# Patient Record
Sex: Male | Born: 1975 | Race: White | Hispanic: No | Marital: Married | Smoking: Current every day smoker
Health system: Southern US, Community
[De-identification: ages and names within clinical notes are randomized; demographics above are authoritative.]

---

## 2004-07-26 ENCOUNTER — Emergency Department: Payer: Self-pay | Admitting: General Practice

## 2004-11-09 ENCOUNTER — Emergency Department: Payer: Self-pay | Admitting: Emergency Medicine

## 2005-01-25 ENCOUNTER — Emergency Department: Payer: Self-pay | Admitting: Emergency Medicine

## 2005-01-27 ENCOUNTER — Emergency Department: Payer: Self-pay | Admitting: Emergency Medicine

## 2006-02-01 ENCOUNTER — Emergency Department: Payer: Self-pay | Admitting: Emergency Medicine

## 2006-02-24 ENCOUNTER — Emergency Department: Payer: Self-pay | Admitting: Internal Medicine

## 2006-03-04 ENCOUNTER — Emergency Department: Payer: Self-pay | Admitting: Emergency Medicine

## 2006-07-10 ENCOUNTER — Emergency Department: Payer: Self-pay | Admitting: Unknown Physician Specialty

## 2006-10-03 ENCOUNTER — Emergency Department: Payer: Self-pay

## 2006-10-06 ENCOUNTER — Emergency Department: Payer: Self-pay | Admitting: Emergency Medicine

## 2006-12-20 ENCOUNTER — Emergency Department: Payer: Self-pay | Admitting: Emergency Medicine

## 2007-04-29 ENCOUNTER — Emergency Department: Payer: Self-pay | Admitting: Emergency Medicine

## 2007-12-10 ENCOUNTER — Emergency Department: Payer: Self-pay | Admitting: Emergency Medicine

## 2008-05-07 ENCOUNTER — Emergency Department: Payer: Self-pay | Admitting: Emergency Medicine

## 2008-06-30 ENCOUNTER — Emergency Department: Payer: Self-pay | Admitting: Emergency Medicine

## 2008-07-06 ENCOUNTER — Emergency Department: Payer: Self-pay | Admitting: Emergency Medicine

## 2008-11-23 ENCOUNTER — Emergency Department: Payer: Self-pay | Admitting: Unknown Physician Specialty

## 2009-03-10 ENCOUNTER — Emergency Department: Payer: Self-pay

## 2009-03-23 ENCOUNTER — Emergency Department: Payer: Self-pay | Admitting: Emergency Medicine

## 2009-04-19 ENCOUNTER — Emergency Department: Payer: Self-pay | Admitting: Emergency Medicine

## 2009-04-21 ENCOUNTER — Emergency Department: Payer: Self-pay | Admitting: Unknown Physician Specialty

## 2009-09-28 ENCOUNTER — Emergency Department: Payer: Self-pay | Admitting: Emergency Medicine

## 2010-07-13 ENCOUNTER — Emergency Department: Payer: Self-pay | Admitting: Emergency Medicine

## 2010-11-10 ENCOUNTER — Emergency Department (HOSPITAL_COMMUNITY)
Admission: EM | Admit: 2010-11-10 | Discharge: 2010-11-11 | Disposition: A | Discharge status: Home / Self Care | Payer: Self-pay | Attending: Emergency Medicine | Admitting: Emergency Medicine

## 2010-11-10 DIAGNOSIS — IMO0001 Reserved for inherently not codable concepts without codable children: Secondary | ICD-10-CM | POA: Insufficient documentation

## 2010-11-10 DIAGNOSIS — R630 Anorexia: Secondary | ICD-10-CM | POA: Insufficient documentation

## 2010-11-10 DIAGNOSIS — R509 Fever, unspecified: Secondary | ICD-10-CM | POA: Insufficient documentation

## 2010-11-10 DIAGNOSIS — R5381 Other malaise: Secondary | ICD-10-CM | POA: Insufficient documentation

## 2010-11-10 DIAGNOSIS — R059 Cough, unspecified: Secondary | ICD-10-CM | POA: Insufficient documentation

## 2010-11-10 DIAGNOSIS — R05 Cough: Secondary | ICD-10-CM | POA: Insufficient documentation

## 2010-11-10 DIAGNOSIS — B9789 Other viral agents as the cause of diseases classified elsewhere: Secondary | ICD-10-CM | POA: Insufficient documentation

## 2010-11-10 DIAGNOSIS — R110 Nausea: Secondary | ICD-10-CM | POA: Insufficient documentation

## 2010-11-10 DIAGNOSIS — E876 Hypokalemia: Secondary | ICD-10-CM | POA: Insufficient documentation

## 2010-11-10 DIAGNOSIS — R000 Tachycardia, unspecified: Secondary | ICD-10-CM | POA: Insufficient documentation

## 2010-11-10 DIAGNOSIS — R5383 Other fatigue: Secondary | ICD-10-CM | POA: Insufficient documentation

## 2010-11-10 LAB — BASIC METABOLIC PANEL
CO2: 29 mEq/L (ref 19–32)
Calcium: 9.1 mg/dL (ref 8.4–10.5)
Chloride: 102 mEq/L (ref 96–112)
GFR calc Af Amer: >60 mL/min (ref >60)
Sodium: 136 mEq/L (ref 135–145)

## 2010-11-10 LAB — DIFFERENTIAL
Basophils Absolute: 0 10*3/uL (ref 0.0–0.1)
Basophils Relative: 0 % (ref 0–1)
Neutro Abs: 4.6 10*3/uL (ref 1.7–7.7)
Neutrophils Relative %: 62 % (ref 43–77)

## 2010-11-10 LAB — CBC
Hemoglobin: 14.2 g/dL (ref 13.0–17.0)
RBC: 4.72 MIL/uL (ref 4.22–5.81)

## 2010-11-11 LAB — URINALYSIS, ROUTINE W REFLEX MICROSCOPIC
Glucose, UA: NEGATIVE mg/dL
Hgb urine dipstick: NEGATIVE
Specific Gravity, Urine: 1.023 (ref 1.005–1.030)
Urobilinogen, UA: 1 mg/dL (ref 0.0–1.0)

## 2011-04-26 ENCOUNTER — Emergency Department: Payer: Self-pay | Admitting: Emergency Medicine

## 2011-08-01 ENCOUNTER — Emergency Department: Payer: Self-pay | Admitting: Emergency Medicine

## 2011-09-01 ENCOUNTER — Emergency Department: Payer: Self-pay | Admitting: Unknown Physician Specialty

## 2012-04-09 ENCOUNTER — Emergency Department: Payer: Self-pay | Admitting: Emergency Medicine

## 2012-10-07 ENCOUNTER — Emergency Department: Payer: Self-pay | Admitting: Emergency Medicine

## 2013-01-08 ENCOUNTER — Emergency Department: Payer: Self-pay | Admitting: Emergency Medicine

## 2013-03-18 ENCOUNTER — Emergency Department: Payer: Self-pay | Admitting: Emergency Medicine

## 2013-03-18 LAB — URINALYSIS, COMPLETE
Hyaline Cast: 3
Leukocyte Esterase: NEGATIVE
Ph: 5 (ref 4.5–8.0)
RBC,UR: <1 /HPF (ref 0–5)

## 2014-06-03 ENCOUNTER — Emergency Department: Payer: Self-pay | Admitting: Emergency Medicine

## 2014-06-03 LAB — COMPREHENSIVE METABOLIC PANEL
ALBUMIN: 4.2 g/dL (ref 3.4–5.0)
ALK PHOS: 85 U/L
ALT: 35 U/L
Anion Gap: 6 — ABNORMAL LOW (ref 7–16)
BILIRUBIN TOTAL: 0.2 mg/dL (ref 0.2–1.0)
BUN: 12 mg/dL (ref 7–18)
CALCIUM: 9.3 mg/dL (ref 8.5–10.1)
CREATININE: 0.77 mg/dL (ref 0.60–1.30)
Chloride: 100 mmol/L (ref 98–107)
Co2: 32 mmol/L (ref 21–32)
EGFR (African American): >60
EGFR (Non-African Amer.): >60
GLUCOSE: 115 mg/dL — AB (ref 65–99)
OSMOLALITY: 276 (ref 275–301)
POTASSIUM: 3.5 mmol/L (ref 3.5–5.1)
SGOT(AST): 26 U/L (ref 15–37)
Sodium: 138 mmol/L (ref 136–145)
Total Protein: 7.6 g/dL (ref 6.4–8.2)

## 2014-06-03 LAB — CBC WITH DIFFERENTIAL/PLATELET
BASOS ABS: 0.2 10*3/uL — AB (ref 0.0–0.1)
Basophil %: 0.9 %
EOS ABS: 0.2 10*3/uL (ref 0.0–0.7)
EOS PCT: 1.2 %
HCT: 47.4 % (ref 40.0–52.0)
HGB: 16.2 g/dL (ref 13.0–18.0)
Lymphocyte #: 2.6 10*3/uL (ref 1.0–3.6)
Lymphocyte %: 14.8 %
MCH: 30.5 pg (ref 26.0–34.0)
MCHC: 34.2 g/dL (ref 32.0–36.0)
MCV: 89 fL (ref 80–100)
MONO ABS: 1.3 x10 3/mm — AB (ref 0.2–1.0)
MONOS PCT: 7.2 %
NEUTROS ABS: 13.3 10*3/uL — AB (ref 1.4–6.5)
Neutrophil %: 75.9 %
Platelet: 252 10*3/uL (ref 150–440)
RBC: 5.31 10*6/uL (ref 4.40–5.90)
RDW: 12.7 % (ref 11.5–14.5)
WBC: 17.6 10*3/uL — AB (ref 3.8–10.6)

## 2014-06-03 LAB — URINALYSIS, COMPLETE
BACTERIA: NONE SEEN
BILIRUBIN, UR: NEGATIVE
BLOOD: NEGATIVE
Glucose,UR: NEGATIVE mg/dL (ref 0–75)
KETONE: NEGATIVE
Leukocyte Esterase: NEGATIVE
Nitrite: NEGATIVE
PH: 7 (ref 4.5–8.0)
Protein: NEGATIVE
RBC,UR: NONE SEEN /HPF (ref 0–5)
SQUAMOUS EPITHELIAL: NONE SEEN
Specific Gravity: 1.016 (ref 1.003–1.030)
WBC UR: NONE SEEN /HPF (ref 0–5)

## 2014-06-03 LAB — LIPASE, BLOOD: Lipase: 90 U/L (ref 73–393)

## 2016-01-26 ENCOUNTER — Encounter: Payer: Self-pay | Admitting: Medical Oncology

## 2016-01-26 ENCOUNTER — Emergency Department
Admission: EM | Admit: 2016-01-26 | Discharge: 2016-01-26 | Disposition: A | Discharge status: Home / Self Care | Payer: Self-pay | Attending: Emergency Medicine | Admitting: Emergency Medicine

## 2016-01-26 DIAGNOSIS — Y939 Activity, unspecified: Secondary | ICD-10-CM | POA: Insufficient documentation

## 2016-01-26 DIAGNOSIS — Y929 Unspecified place or not applicable: Secondary | ICD-10-CM | POA: Insufficient documentation

## 2016-01-26 DIAGNOSIS — S80262A Insect bite (nonvenomous), left knee, initial encounter: Secondary | ICD-10-CM | POA: Insufficient documentation

## 2016-01-26 DIAGNOSIS — W57XXXA Bitten or stung by nonvenomous insect and other nonvenomous arthropods, initial encounter: Secondary | ICD-10-CM | POA: Insufficient documentation

## 2016-01-26 DIAGNOSIS — Y999 Unspecified external cause status: Secondary | ICD-10-CM | POA: Insufficient documentation

## 2016-01-26 MED ORDER — TETANUS-DIPHTHERIA TOXOIDS TD 5-2 LFU IM INJ
0.5000 mL | INJECTION | Freq: Once | INTRAMUSCULAR | Status: AC
  Administered 2016-01-26: 0.5 mL via INTRAMUSCULAR

## 2016-01-26 MED ORDER — DOXYCYCLINE HYCLATE 100 MG PO TBEC: 100.0000 mg | DELAYED_RELEASE_TABLET | Freq: Two times a day (BID) | ORAL | Status: DC

## 2016-01-26 NOTE — ED Notes (Signed)
Pt reports he had a tick Friday that he removed from left leg. Pt reports body aches since last night.

## 2016-01-26 NOTE — ED Notes (Signed)
MD at bedside. 

## 2016-01-26 NOTE — ED Notes (Signed)
Pt presents with tick bite to his left posterior knee. Pt states it was removed yesterday and he began to feel "sick" this morning with fatigue, cramping, joint pain.

## 2016-01-26 NOTE — ED Provider Notes (Signed)
Time Seen: Approximately 1830  I have reviewed the triage notes  Chief Complaint: tick bite    History of Present Illness: Lucas Cook is a 40 y.o. male who apparently was found with a tick located behind his left knee. Apparently it was removed and the patient started to feel this morning some nausea and generalized fatigue some cramping in joint discomfort. There was no rash or fever was noted. [place] and has exposure to multiple ticks. The patient's wife removed the tick and states that she was able to remove all portions of the tick and it definitely fed Tetanus status is unknown  History reviewed. No pertinent past medical history.  There are no active problems to display for this patient.   No past surgical history on file.  No past surgical history on file.  No current outpatient prescriptions on file.  Allergies:  Review of patient's allergies indicates no known allergies.  Family History: No family history on file.  Social History: Social History  Substance Use Topics  . Smoking status: None  . Smokeless tobacco: None  . Alcohol Use: None     Review of Systems:   10 point review of systems was performed and was otherwise negative:  Constitutional: No fever Eyes: No visual disturbances ENT: No sore throat, ear pain Cardiac: No chest pain Respiratory: No shortness of breath, wheezing, or stridor Abdomen: No abdominal pain, no vomiting, No diarrhea Endocrine: No weight loss, No night sweats Extremities: No peripheral edema, cyanosis Skin: No rashes, easy bruising Neurologic: No focal weakness, trouble with speech or swollowing Urologic: No dysuria, Hematuria, or urinary frequency *  Physical Exam:  ED Triage Vitals  Enc Vitals Group     BP 01/26/16 1758 127/83 mmHg     Pulse Rate 01/26/16 1758 59     Resp 01/26/16 1758 18     Temp 01/26/16 1758 97.5 F (36.4 C)     Temp Source 01/26/16 1758 Oral   SpO2 01/26/16 1758 99 %     Weight 01/26/16 1758 140 lb (63.504 kg)     Height 01/26/16 1758  (1.753 m)     Head Cir --      Peak Flow --      Pain Score 01/26/16 1759 6     Pain Loc --      Pain Edu? --      Excl. in GC? --     General: Awake , Alert , and Oriented times 3; GCS 15 Head: Normal cephalic , atraumatic Eyes: Pupils equal , round, reactive to light Nose/Throat: No nasal drainage, patent upper airway without erythema or exudate.  Neck: Supple, Full range of motion, No anterior adenopathy or palpable thyroid masses Lungs: Clear to ascultation without wheezes , rhonchi, or rales Heart: Regular rate, regular rhythm without murmurs , gallops , or rubs Abdomen: Soft, non tender without rebound, guarding , or rigidity; bowel sounds positive and symmetric in all 4 quadrants. No organomegaly .        Extremities: Visualization of the wound shows 2 areas of red puncture wounds behind the surface of his left knee. There seems to be some tenderness to underlying superficial vein and possible early thrombophlebitis. Neurologic: normal ambulation, Motor symmetric without deficits, sensory intact Skin: warm, dry, no rashes   L ED Course:  The patient was given a tetanus shot here and will be prescribed doxycycline for a full 7 day course.  Patient was  advised to place warm compresses over the tick bite site. Side effect instructions were given on doxycycline such as sun exposure and GI irritation.    Assessment:  Tick bite Thrombophlebitis     Plan: * Outpatient management Patient was advised to return immediately if condition worsens. Patient was advised to follow up with their primary care physician or other specialized physicians involved in their outpatient care. The patient and/or family member/power of attorney had laboratory results reviewed at the bedside. All questions and concerns were addressed and appropriate discharge instructions were distributed by the  nursing staff.             Jennye MoccasinBrian S Kenlynn Houde, MD 01/26/16 [phone]

## 2016-01-26 NOTE — Discharge Instructions (Signed)
Tick Bite Information Ticks are insects that attach themselves to the skin. There are many types of ticks. Common types include wood ticks and deer ticks. Sometimes, ticks carry diseases that can make a person very ill. The most common places for ticks to attach themselves are the scalp, neck, armpits, waist, and groin.  HOW CAN YOU PREVENT TICK BITES? Take these steps to help prevent tick bites when you are outdoors:  Wear long sleeves and long pants.  Wear white clothes so you can see ticks more easily.  Tuck your pant legs into your socks.  If walking on a trail, stay in the middle of the trail to avoid brushing against bushes.  Avoid walking through areas with long grass.  Put bug spray on all skin that is showing and along boot tops, pant legs, and sleeve cuffs.  Check clothes, hair, and skin often and before going inside.  Brush off any ticks that are not attached.  Take a shower or bath as soon as possible after being outdoors. HOW SHOULD YOU REMOVE A TICK? Ticks should be removed as soon as possible to help prevent diseases. 1. If latex gloves are available, put them on before trying to remove a tick. 2. Use tweezers to grasp the tick as close to the skin as possible. You may also use curved forceps or a tick removal tool. Grasp the tick as close to its head as possible. Avoid grasping the tick on its body. 3. Pull gently upward until the tick lets go. Do not twist the tick or jerk it suddenly. This may break off the tick's head or mouth parts. 4. Do not squeeze or crush the tick's body. This could force disease-carrying fluids from the tick into your body. 5. After the tick is removed, wash the bite area and your hands with soap and water or alcohol. 6. Apply a small amount of antiseptic cream or ointment to the bite site. 7. Wash any tools that were used. Do not try to remove a tick by applying a hot match, petroleum jelly, or fingernail polish to the tick. These methods do  not work. They may also increase the chances of disease being spread from the tick bite. WHEN SHOULD YOU SEEK HELP? Contact your health care provider if you are unable to remove a tick or if a part of the tick breaks off in the skin. After a tick bite, you need to watch for signs and symptoms of diseases that can be spread by ticks. Contact your health care provider if you develop any of the following:  Fever.  Rash.  Redness and puffiness (swelling) in the area of the tick bite.  Tender, puffy lymph glands.  Watery poop (diarrhea).  Weight loss.  Cough.  Feeling more tired than normal (fatigue).  Muscle, joint, or bone pain.  Belly (abdominal) pain.  Headache.  Change in your level of consciousness.  Trouble walking or moving your legs.  Loss of feeling (numbness) in the legs.  Loss of movement (paralysis).  Shortness of breath.  Confusion.  Throwing up (vomiting) many times.   This information is not intended to replace advice given to you by your health care provider. Make sure you discuss any questions you have with your health care provider.   Document Released: 11/09/2009 Document Revised: 04/17/2013 Document Reviewed: 01/23/2013 Elsevier Interactive Patient Education [employer]  Please return immediately if condition worsens. Please contact her primary physician or the physician you were given for referral. If  If you have any specialist physicians involved in her treatment and plan please also contact them. Thank you for using [facility]. ° °

## 2016-01-26 NOTE — ED Notes (Signed)
Discussed discharge instructions, prescriptions, and follow-up care with patient. No questions or concerns at this time. Pt stable at discharge.  

## 2016-06-20 ENCOUNTER — Ambulatory Visit: Payer: Self-pay | Admitting: Unknown Physician Specialty

## 2017-03-09 ENCOUNTER — Other Ambulatory Visit: Payer: Self-pay | Admitting: Neurology

## 2017-03-09 DIAGNOSIS — M5416 Radiculopathy, lumbar region: Secondary | ICD-10-CM

## 2017-03-15 ENCOUNTER — Ambulatory Visit: Payer: No Typology Code available for payment source

## 2017-06-06 ENCOUNTER — Ambulatory Visit: Payer: Self-pay | Admitting: Registered Nurse

## 2017-06-06 VITALS — BP 120/75 | HR 73 | Temp 98.1°F

## 2017-06-06 DIAGNOSIS — R202 Paresthesia of skin: Secondary | ICD-10-CM

## 2017-06-06 MED ORDER — IBUPROFEN 800 MG PO TABS: 800.0000 mg | ORAL_TABLET | Freq: Three times a day (TID) | ORAL | 0 refills | Status: AC | PRN

## 2017-06-06 NOTE — Patient Instructions (Signed)
Wrist and Forearm Exercises Ask your health care provider which exercises are safe for you. Do exercises exactly as told by your health care provider and adjust them as directed. It is normal to feel mild stretching, pulling, tightness, or discomfort as you do these exercises, but you should stop right away if you feel sudden pain or your pain gets worse. Do not begin these exercises until told by your health care provider. RANGE OF MOTION EXERCISES These exercises warm up your muscles and joints and improve the movement and flexibility of your injured wrist and forearm. These exercises also help to relieve pain, numbness, and tingling. These exercises are done using the muscles in your injured wrist and forearm. Exercise A: Wrist Flexion, Active 1. With your fingers relaxed, bend your wrist forward as far as you can. 2. Hold this position for __________ seconds. Repeat __________ times. Complete this exercise __________ times a day. Exercise B: Wrist Extension, Active 1. With your fingers relaxed, bend your wrist backward as far as you can. 2. Hold this position for __________ seconds. Repeat __________ times. Complete this exercise __________ times a day. Exercise C: Supination, Active  1. Stand or sit with your arms at your sides. 2. Bend your left / right elbow to an "L" shape (90 degrees). 3. Turn your palm upward until you feel a gentle stretch on the inside of your forearm. 4. Hold this position for __________ seconds. 5. Slowly return your palm to the starting position. Repeat __________ times. Complete this exercise __________ times a day. Exercise D: Pronation, Active  1. Stand or sit with your arms at your sides. 2. Bend your left / right elbow to an "L" shape (90 degrees). 3. Turn your palm downward until you feel a gentle stretch on the top of your forearm. 4. Hold this position for __________ seconds. 5. Slowly return your palm to the starting position. Repeat __________  times. Complete this exercise __________ times a day. STRETCHING EXERCISES These exercises warm up your muscles and joints and improve the movement and flexibility of your injured wrist and forearm. These exercises also help to relieve pain, numbness, and tingling. These exercises are done using your healthy wrist and forearm to help stretch the muscles in your injured wrist and forearm. Exercise E: Wrist Flexion, Passive  1. Extend your left / right arm in front of you, relax your wrist, and point your fingers downward. 2. Gently push on the back of your hand. Stop when you feel a gentle stretch on the top of your forearm. 3. Hold this position for __________ seconds. Repeat __________ times. Complete this exercise __________ times a day. Exercise F: Wrist Extension, Passive  1. Extend your left / right arm in front of you and turn your palm upward. 2. Gently pull your palm and fingertips back so your fingers point downward. You should feel a gentle stretch on the palm-side of your forearm. 3. Hold this position for __________ seconds. Repeat __________ times. Complete this exercise __________ times a day. Exercise G: Forearm Rotation, Supination, Passive 1. Sit with your left / right elbow bent to an "L" shape (90 degrees) with your forearm resting on a table. 2. Keeping your upper body and shoulder still, use your other hand to rotate your forearm palm-up until you feel a gentle to moderate stretch. 3. Hold this position for __________ seconds. 4. Slowly release the stretch and return to the starting position. Repeat __________ times. Complete this exercise __________ times a day. Exercise H:   Forearm Rotation, Pronation, Passive 1. Sit with your left / right elbow bent to an "L" shape (90 degrees) with your forearm resting on a table. 2. Keeping your upper body and shoulder still, use your other hand to rotate your forearm palm-down until you feel a gentle to moderate stretch. 3. Hold this  position for __________ seconds. 4. Slowly release the stretch and return to the starting position. Repeat __________ times. Complete this exercise __________ times a day. STRENGTHENING EXERCISES These exercises build strength and endurance in your wrist and forearm. Endurance is the ability to use your muscles for a long time, even after they get tired. Exercise I: Wrist Flexors  1. Sit with your left / right forearm supported on a table and your hand resting palm-up over the edge of the table. Your elbow should be bent to an "L" shape (about 90 degrees) and be below the level of your shoulder. 2. Hold a __________ weight in your left / right hand. Or, hold a rubber exercise band or tube in both hands, keeping your hands at the same level and hip distance apart. There should be a slight tension in the exercise band or tube. 3. Slowly curl your hand up toward your forearm. 4. Hold this position for __________ seconds. 5. Slowly lower your hand back to the starting position. Repeat __________ times. Complete this exercise __________ times a day. Exercise J: Wrist Extensors  1. Sit with your left / right forearm supported on a table and your hand resting palm-down over the edge of the table. Your elbow should be bent to an "L" shape (about 90 degrees) and be below the level of your shoulder. 2. Hold a __________ weight in your left / right hand. Or, hold a rubber exercise band or tube in both hands, keeping your hands at the same level and hip distance apart. There should be a slight tension in the exercise band or tube. 3. Slowly curl your hand up toward your forearm. 4. Hold this position for __________ seconds. 5. Slowly lower your hand back to the starting position. Repeat __________ times. Complete this exercise __________ times a day. Exercise K: Forearm Rotation, Supination  1. Sit with your left / right forearm supported on a table and your hand resting palm-down. Your elbow should be at  your side, bent to an "L" shape (about 90 degrees), and below the level of your shoulder. Keep your wrist stable and in a neutral position throughout the exercise. 2. Gently hold a lightweight hammer with your left / right hand. 3. Without moving your elbow or wrist, slowly rotate your palm upward to a thumbs-up position. 4. Hold this position for __________ seconds. 5. Slowly return your forearm to the starting position. Repeat __________ times. Complete this exercise __________ times a day. Exercise L: Forearm Rotation, Pronation  1. Sit with your left / right forearm supported on a table and your hand resting palm-up. Your elbow should be at your side, bent to an "L" shape (about 90 degrees), and below the level of your shoulder. Keep your wrist stable. Do not allow it to move backward or forward during the exercise. 2. Gently hold a lightweight hammer with your left / right hand. 3. Without moving your elbow or wrist, slowly rotate your palm and hand upward to a thumbs-up position. 4. Hold this position for __________ seconds. 5. Slowly return your forearm to the starting position. Repeat __________ times. Complete this exercise __________ times a day. Exercise M: Grip   Strengthening  1. Hold one of these items in your left / right hand: play dough, therapy putty, a dense sponge, a stress ball, or a large, rolled sock. 2. Squeeze as hard as you can without increasing pain. 3. Hold this position for __________ seconds. 4. Slowly release your grip. Repeat __________ times. Complete this exercise __________ times a day. This information is not intended to replace advice given to you by your health care provider. Make sure you discuss any questions you have with your health care provider. Document Released: 06/29/2005 Document Revised: 05/09/2016 Document Reviewed: 05/10/2015 Elsevier Interactive Patient Education  2018 [employer]. Carpal Tunnel Syndrome Carpal tunnel syndrome is a  condition that causes pain in your hand and arm. The carpal tunnel is a narrow area that is on the palm side of your wrist. Repeated wrist motion or certain diseases may cause swelling in the tunnel. This swelling can pinch the main nerve in the wrist (median nerve). Follow these instructions at home: If you have a splint:  Wear it as told by your doctor. Remove it only as told by your doctor.  Loosen the splint if your fingers: ? Become numb and tingle. ? Turn blue and cold.  Keep the splint clean and dry. General instructions  Take over-the-counter and prescription medicines only as told by your doctor.  Rest your wrist from any activity that may be causing your pain. If needed, talk to your employer about changes that can be made in your work, such as getting a wrist pad to use while typing.  If directed, apply ice to the painful area: ? Put ice in a plastic bag. ? Place a towel between your skin and the bag. ? Leave the ice on for 20 minutes, 2-3 times per day.  Keep all follow-up visits as told by your doctor. This is important.  Do any exercises as told by your doctor, physical therapist, or occupational therapist. Contact a doctor if:  You have new symptoms.  Medicine does not help your pain.  Your symptoms get worse. This information is not intended to replace advice given to you by your health care provider. Make sure you discuss any questions you have with your health care provider. Document Released: 08/04/2011 Document Revised: 01/21/2016 Document Reviewed: 12/31/2014 Elsevier Interactive Patient Education  [employer].
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
## 2017-06-13 ENCOUNTER — Other Ambulatory Visit: Payer: Self-pay | Admitting: Family Medicine

## 2017-06-13 ENCOUNTER — Ambulatory Visit
Admission: RE | Admit: 2017-06-13 | Discharge: 2017-06-13 | Disposition: A | Discharge status: Home / Self Care | Payer: PRIVATE HEALTH INSURANCE | Source: Ambulatory Visit | Attending: Family Medicine | Admitting: Family Medicine

## 2017-06-13 DIAGNOSIS — M79644 Pain in right finger(s): Secondary | ICD-10-CM | POA: Insufficient documentation

## 2017-06-13 DIAGNOSIS — M79641 Pain in right hand: Secondary | ICD-10-CM

## 2017-06-13 DIAGNOSIS — Z8781 Personal history of (healed) traumatic fracture: Secondary | ICD-10-CM | POA: Diagnosis not present

## 2018-02-26 ENCOUNTER — Encounter: Payer: Self-pay | Admitting: *Deleted

## 2018-02-26 ENCOUNTER — Ambulatory Visit: Payer: Self-pay | Admitting: *Deleted

## 2018-02-26 DIAGNOSIS — S61210A Laceration without foreign body of right index finger without damage to nail, initial encounter: Secondary | ICD-10-CM

## 2018-02-26 NOTE — Patient Instructions (Signed)
[facility] for further [facility] and likely sutures. A member of Facilities will transport you. I will receive their report of any restrictions today or tomorrow. They will likely have you return in 7-10 days for suture removal. Watch for any signs of infection including redness, warmth, swelling, pain, oozing/draining. Follow their instructions for wound care. Follow up with me in clinic as needed.

## 2018-02-26 NOTE — Progress Notes (Signed)
Pt presents with laceration to R index finger, just proximal to DIP joint. Occurred at approx 1010 today. Was unboxing a serrated butter knife and trying to get a zip tie off it and cut finger. Last tetanus booster 1-2 years ago per pt. May 2017 per epic chart review.  Pressure held to site for 20min but still oozing. Wounds edges appear to approximate well. Bleeding controlled enough to see depth of laceration worsens as it extends from lateral to anterior aspect of finger. Wound extends at least past the dermis into subcutaneous tissues.  Unable to visualize more detail due to bleeding.   CMS intact. Do not suspect tendon or ligament injury, fracture, or retained foreign body. Wound cleansed as possible with first aid antiseptic spray. Gauze layers replaced with clean dressings and wrapped tightly with cobain as pressure dressing to help complete hemostasis. Pt advised to unwrap and manually hold pressure if sensation is lost, or apply additional dressings and pressure manually if he begins bleeding through pressure dressing.   [facility] Authorization form completed for pt and [facility] further treatment including sutures as this cannot be performed on site. Pt transported by member of facilities team. Message left for [facility] with pt's demographics, injury report, and requested treatment plan including Initial WC visit and non-NIDA UDS per protocol. Pt to f/u in RN clinic prn. He denies any further questions/concerns.

## 2018-02-27 ENCOUNTER — Ambulatory Visit: Payer: Self-pay | Admitting: Registered Nurse

## 2018-02-27 ENCOUNTER — Encounter: Payer: Self-pay | Admitting: Registered Nurse

## 2018-02-27 DIAGNOSIS — S61219D Laceration without foreign body of unspecified finger without damage to nail, subsequent encounter: Secondary | ICD-10-CM

## 2018-02-27 NOTE — Patient Instructions (Signed)
Sutured Wound Care Sutures are stitches that can be used to close wounds. Taking care of your wound properly can help to prevent pain and infection. It can also help your wound to heal more quickly. How is this treated? Wound Care  Keep the wound clean and dry.  If you were given a bandage (dressing), you should change it at least once per day or as directed by your health care provider. You should also change it if it becomes wet or dirty.  Keep the wound completely dry for the first 24 hours or as directed by your health care provider. After that time, you may shower or bathe. However, make sure that the wound is not soaked in water until the sutures have been removed.  Clean the wound one time each day or as directed by your health care provider. ? Wash the wound with soap and water. ? Rinse the wound with water to remove all soap. ? Pat the wound dry with a clean towel. Do not rub the wound.  Aftercleaning the wound, apply a thin layer of antibioticointment as directed by your health care provider. This will help to prevent infection and keep the dressing from sticking to the wound.  Have the sutures removed as directed by your health care provider. General Instructions  Take or apply medicines only as directed by your health care provider.  To help prevent scarring, make sure to cover your wound with sunscreen whenever you are outside after the sutures are removed and the wound is healed. Make sure to wear a sunscreen of at least 30 SPF.  If you were prescribed an antibiotic medicine or ointment, finish all of it even if you start to feel better.  Do not scratch or pick at the wound.  Keep all follow-up visits as directed by your health care provider. This is important.  Check your wound every day for signs of infection. Watch for: ? Redness, swelling, or pain. ? Fluid, blood, or pus.  Raise (elevate) the injured area above the level of your heart while you are sitting or  lying down, if possible.  Avoid stretching your wound.  Drink enough fluids to keep your urine clear or pale yellow. Contact a health care provider if:  You received a tetanus shot and you have swelling, severe pain, redness, or bleeding at the injection site.  You have a fever.  A wound that was closed breaks open.  You notice a bad smell coming from the wound.  You notice something coming out of the wound, such as wood or glass.  Your pain is not controlled with medicine.  You have increased redness, swelling, or pain at the site of your wound.  You have fluid, blood, or pus coming from your wound.  You notice a change in the color of your skin near your wound.  You need to change the dressing frequently due to fluid, blood, or pus draining from the wound.  You develop a new rash.  You develop numbness around the wound. Get help right away if:  You develop severe swelling around the injury site.  Your pain suddenly increases and is severe.  You develop painful lumps near the wound or on skin that is anywhere on your body.  You have a red streak going away from your wound.  The wound is on your hand or foot and you cannot properly move a finger or toe.  The wound is on your hand or foot and you notice  that your fingers or toes look pale or bluish. This information is not intended to replace advice given to you by your health care provider. Make sure you discuss any questions you have with your health care provider. Document Released: 09/22/2004 Document Revised: 01/21/2016 Document Reviewed: 03/27/2013 Elsevier Interactive Patient Education  2017 [employer] Laceration Care, Adult A laceration is a cut that goes through all of the layers of the skin and into the tissue that is right under the skin. Some lacerations heal on their own. Others need to be closed with stitches (sutures), staples, skin adhesive strips, or skin glue. Proper laceration care minimizes the  risk of infection and helps the laceration to heal better. How is this treated? If sutures or staples were used:  Keep the wound clean and dry.  If you were given a bandage (dressing), you should change it at least one time per day or as told by your health care provider. You should also change it if it becomes wet or dirty.  Keep the wound completely dry for the first 24 hours or as told by your health care provider. After that time, you may shower or bathe. However, make sure that the wound is not soaked in water until after the sutures or staples have been removed.  Clean the wound one time each day or as told by your health care provider: ? Wash the wound with soap and water. ? Rinse the wound with water to remove all soap. ? Pat the wound dry with a clean towel. Do not rub the wound.  After cleaning the wound, apply a thin layer of antibiotic ointmentas told by your health care provider. This will help to prevent infection and keep the dressing from sticking to the wound.  Have the sutures or staples removed as told by your health care provider. If skin adhesive strips were used:  Keep the wound clean and dry.  If you were given a bandage (dressing), you should change it at least one time per day or as told by your health care provider. You should also change it if it becomes dirty or wet.  Do not get the skin adhesive strips wet. You may shower or bathe, but be careful to keep the wound dry.  If the wound gets wet, pat it dry with a clean towel. Do not rub the wound.  Skin adhesive strips fall off on their own. You may trim the strips as the wound heals. Do not remove skin adhesive strips that are still stuck to the wound. They will fall off in time. If skin glue was used:  Try to keep the wound dry, but you may briefly wet it in the shower or bath. Do not soak the wound in water, such as by swimming.  After you have showered or bathed, gently pat the wound dry with a clean  towel. Do not rub the wound.  Do not do any activities that will make you sweat heavily until the skin glue has fallen off on its own.  Do not apply liquid, cream, or ointment medicine to the wound while the skin glue is in place. Using those may loosen the film before the wound has healed.  If you were given a bandage (dressing), you should change it at least one time per day or as told by your health care provider. You should also change it if it becomes dirty or wet.  If a dressing is placed over the wound, be careful not  to apply tape directly over the skin glue. Doing that may cause the glue to be pulled off before the wound has healed.  Do not pick at the glue. The skin glue usually remains in place for 5-10 days, then it falls off of the skin. General Instructions  Take over-the-counter and prescription medicines only as told by your health care provider.  If you were prescribed an antibiotic medicine or ointment, take or apply it as told by your doctor. Do not stop using it even if your condition improves.  To help prevent scarring, make sure to cover your wound with sunscreen whenever you are outside after stitches are removed, after adhesive strips are removed, or when glue remains in place and the wound is healed. Make sure to wear a sunscreen of at least 30 SPF.  Do not scratch or pick at the wound.  Keep all follow-up visits as told by your health care provider. This is important.  Check your wound every day for signs of infection. Watch for: ? Redness, swelling, or pain. ? Fluid, blood, or pus.  Raise (elevate) the injured area above the level of your heart while you are sitting or lying down, if possible. Contact a health care provider if:  You received a tetanus shot and you have swelling, severe pain, redness, or bleeding at the injection site.  You have a fever.  A wound that was closed breaks open.  You notice a bad smell coming from your wound or your  dressing.  You notice something coming out of the wound, such as wood or glass.  Your pain is not controlled with medicine.  You have increased redness, swelling, or pain at the site of your wound.  You have fluid, blood, or pus coming from your wound.  You notice a change in the color of your skin near your wound.  You need to change the dressing frequently due to fluid, blood, or pus draining from the wound.  You develop a new rash.  You develop numbness around the wound. Get help right away if:  You develop severe swelling around the wound.  Your pain suddenly increases and is severe.  You develop painful lumps near the wound or on skin that is anywhere on your body.  You have a red streak going away from your wound.  The wound is on your hand or foot and you cannot properly move a finger or toe.  The wound is on your hand or foot and you notice that your fingers or toes look pale or bluish. This information is not intended to replace advice given to you by your health care provider. Make sure you discuss any questions you have with your health care provider. Document Released: 08/15/2005 Document Revised: 01/15/2016 Document Reviewed: 08/11/2014 Elsevier Interactive Patient Education  [employer]2018 Else[employer] Inc[employer]

## 2018-02-27 NOTE — Progress Notes (Signed)
Subjective:    Patient ID: Lucas Cook, male    DOB: [birth year], 42 y.o.   MRN: [id number]  42y/o caucasian established male patient here for follow up laceration yesterday at work right index finger.  Per RN Lucas [facility] due to requiring stitches and kept oozing.  Patient reported throbbed some last night but okay this am.  Wife changed bandage last night and applied antibiotic ointment.  Right hand dominant.  [facility] see paper chart copy or systoc.  Denied fever/chills/bleeding/purulent discharge.     Review of Systems  Constitutional: Negative for activity change, appetite change, chills, diaphoresis, fatigue and fever.  HENT: Negative for trouble swallowing and voice change.   Eyes: Negative for photophobia and visual disturbance.  Respiratory: Negative for cough, shortness of breath, wheezing and stridor.   Gastrointestinal: Negative for diarrhea, nausea and vomiting.  Endocrine: Negative for cold intolerance and heat intolerance.  Genitourinary: Negative for difficulty urinating.  Musculoskeletal: Positive for myalgias. Negative for arthralgias, back pain, gait problem, joint swelling, neck pain and neck stiffness.  Skin: Positive for wound. Negative for color change, pallor and rash.  Neurological: Negative for dizziness, tremors, syncope, facial asymmetry, weakness and light-headedness.  Hematological: Negative for adenopathy.  Psychiatric/Behavioral: Negative for agitation and sleep disturbance.       Objective:   Physical Exam  Constitutional: He is oriented to person, place, and time. Vital signs are normal. He appears well-developed and well-nourished. He is active and cooperative.  Non-toxic appearance. He does not have a sickly appearance. He does not appear ill. No distress.  HENT:  Head: Normocephalic and atraumatic.  Right Ear: External ear normal.  Left Ear: External ear normal.  Nose: Nose normal.  Mouth/Throat:  Oropharynx is clear and moist. No oropharyngeal exudate.  Eyes: Pupils are equal, round, and reactive to light. Conjunctivae, EOM and lids are normal. Right eye exhibits no discharge. Left eye exhibits no discharge. No scleral icterus.  Neck: Trachea normal and normal range of motion. Neck supple. No tracheal deviation present.  Cardiovascular: Normal rate, regular rhythm, normal heart sounds and intact distal pulses.  Pulmonary/Chest: Effort normal and breath sounds normal. No stridor. No respiratory distress. He has no wheezes. He has no rales.  Abdominal: Soft.  Musculoskeletal: Normal range of motion. He exhibits no edema or deformity.       Right shoulder: Normal.       Left shoulder: Normal.       Right elbow: Normal.      Left elbow: Normal.       Right hip: Normal.       Left hip: Normal.       Right knee: Normal.       Left knee: Normal.       Cervical back: Normal.       Thoracic back: Normal.       Lumbar back: Normal.       Right hand: He exhibits tenderness, laceration and swelling. He exhibits normal range of motion, no bony tenderness, normal two-point discrimination, normal capillary refill and no deformity. Normal strength noted. He exhibits no finger abduction, no thumb/finger opposition and no wrist extension trouble.       Left hand: Normal.  Right palmar distal phalanx laceration wound edges well approximately dressing clean dry no discharge or bleeding applied finger splint aluminum from clinic stock and secured with cobain  Lymphadenopathy:    He has no cervical adenopathy.  Neurological: He is  alert and oriented to person, place, and time. He has normal strength. He is not disoriented. He displays no atrophy and no tremor. No cranial nerve deficit or sensory deficit. He exhibits normal muscle tone. He displays no seizure activity. Coordination and gait normal. GCS eye subscore is 4. GCS verbal subscore is 5. GCS motor subscore is 6.  On/off exam table;in/out of chair  without difficulty gait sure and steady in hallway; bilateral hand grasp equal 5/5  Skin: Skin is warm and dry. Capillary refill takes less than 2 seconds. Laceration noted. No abrasion, no bruising, no burn, no ecchymosis, no lesion, no petechiae and no rash noted. He is not diaphoretic. No cyanosis or erythema. No pallor. Nails show no clubbing.  Palmar right 4th digit distal phalanx laceration with 2 sutures clean dry intact wound edges well approximated  Psychiatric: He has a normal mood and affect. His speech is normal and behavior is normal. Judgment and thought content normal. Cognition and memory are normal.  Vitals reviewed.         Assessment & Plan:  A-laceration right 4th digit subsequent visit  P-follow up in 1 week for repeat evaluation.  Patient doing well had recently changed dressing good compression clean dry and intact no soiling.  Applied aluminum foam splint secured with cobain.  Avoid direct impact.  Systoc discharge note put in paper record.  Follow up next week for re-evaluation/suture removal.  Patient was instructed to rest, ice and elevate right hand. Do not soak hand until laceration healed avoid pool, lake, hot tub, dirty sink water. Exitcare handout on suture care and laceration. Tylenol 1000mg  po QID prn pain continue washing with soap and water daily and then apply antiobiotic ointment with dressing change daily.  May remove splint for wound care.  Wear to protect wound/sutures at work or if doing any work at home.  Medications as directed. Call or return to clinic as needed if these symptoms worsen or fail to improve as anticipated  Patient verbalized agreement and understanding of treatment plan and had no further questions at this time. P2: ROM, injury prevention

## 2019-02-04 ENCOUNTER — Other Ambulatory Visit: Payer: Self-pay | Admitting: Neurology

## 2019-02-04 DIAGNOSIS — R200 Anesthesia of skin: Secondary | ICD-10-CM

## 2019-02-11 ENCOUNTER — Ambulatory Visit
Admission: RE | Admit: 2019-02-11 | Discharge: 2019-02-11 | Disposition: A | Discharge status: Home / Self Care | Payer: BC Managed Care – PPO | Source: Ambulatory Visit | Attending: Neurology | Admitting: Neurology

## 2019-02-11 ENCOUNTER — Other Ambulatory Visit: Payer: Self-pay

## 2019-02-11 DIAGNOSIS — R200 Anesthesia of skin: Secondary | ICD-10-CM | POA: Diagnosis present

## 2019-02-11 MED ORDER — GADOBUTROL 1 MMOL/ML IV SOLN
6.0000 mL | Freq: Once | INTRAVENOUS | Status: AC | PRN
  Administered 2019-02-11: 6 mL via INTRAVENOUS

## 2020-01-12 IMAGING — MR MRI HEAD WITHOUT AND WITH CONTRAST
13 series · 48 of 48 positions shown · IV contrast (gadavist)
Comparison: CT head 03/05/2006.

CLINICAL DATA: Intermittent numbness, weakness, and pain with
increased muscle tone of both lower extremities.

EXAM:
MRI HEAD WITHOUT AND WITH CONTRAST
TECHNIQUE: Multiplanar, multiecho pulse sequences of the brain and surrounding
structures were obtained without and with intravenous contrast.
CONTRAST:  Gadavist 6 mL.

[Series 5: ax dwi_tracew · axial · 3.0mm · 0.60mm/px · z∈[-7,+154]mm · 4 of 55 slices shown]
[im 1/55]
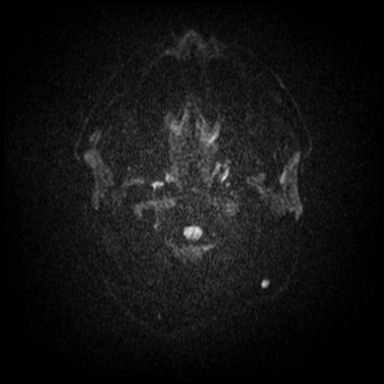
[im 19/55]
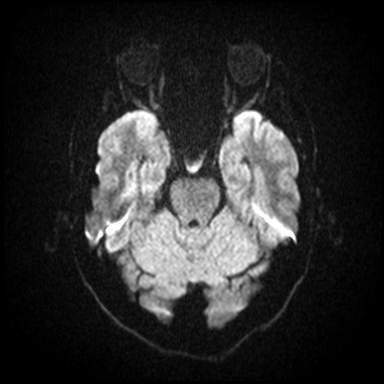
[im 37/55]
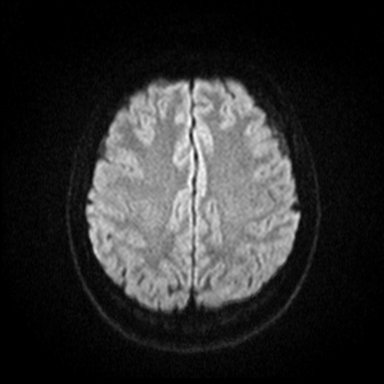
[im 55/55]
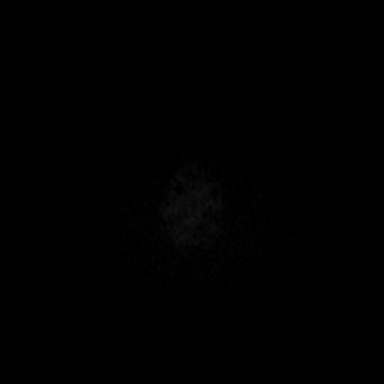

[Series 6: ax dwi_adc · axial · 3.0mm · 0.60mm/px · z∈[-7,+154]mm · 3 of 55 slices shown]
[im 1/55]
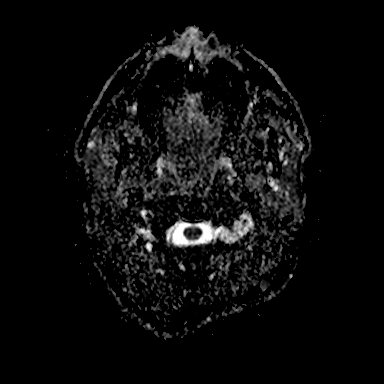
[im 28/55]
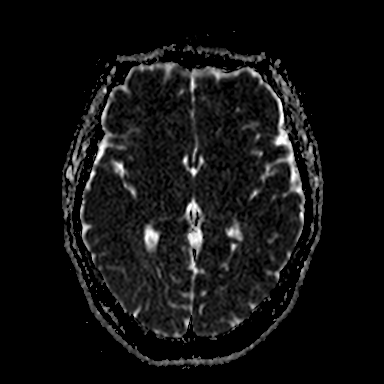
[im 55/55]
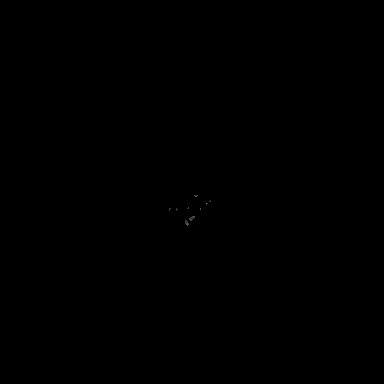

[Series 7: cor dwi_tracew · coronal · 5.0mm · 0.60mm/px · 2 of 38 slices shown]
[im 1/38]
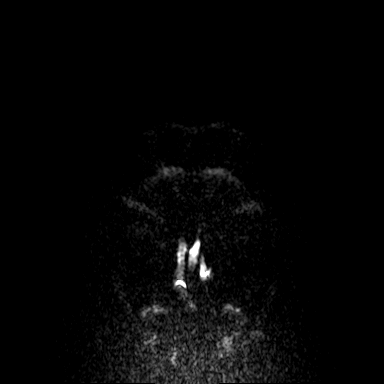
[im 38/38]
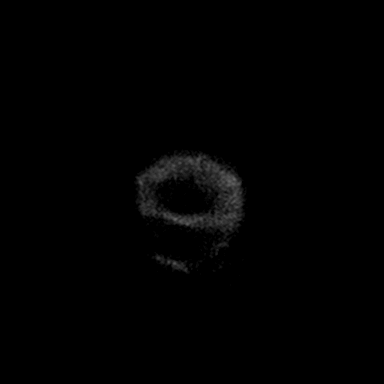

[Series 8: cor dwi_adc · coronal · 5.0mm · 0.60mm/px · 2 of 38 slices shown]
[im 1/38]
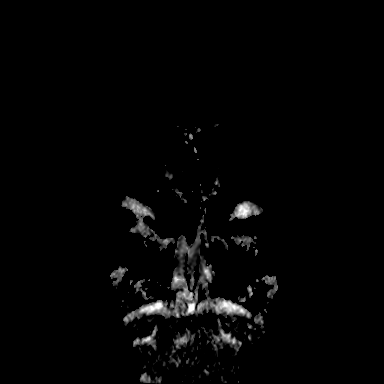
[im 38/38]
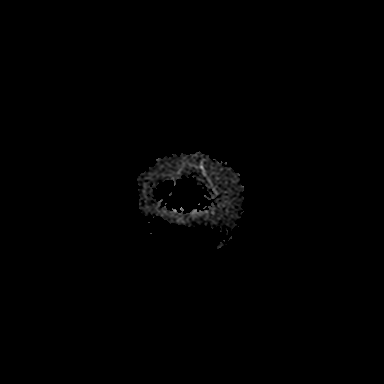

[Series 9: T1 · sagittal · 5.0mm · 0.62mm/px · 1 of 21 slices shown (1 of 2)]
[im 1/21]
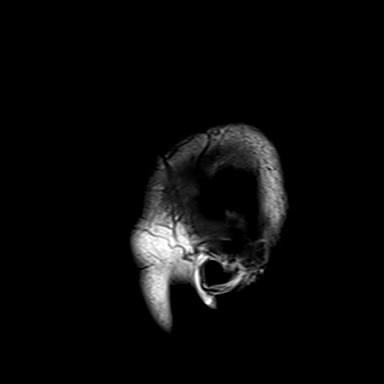

[Series 10: T2 · axial · 5.0mm · 0.53mm/px · z∈[-4,+150]mm · 2 of 27 slices shown]
[im 1/27]
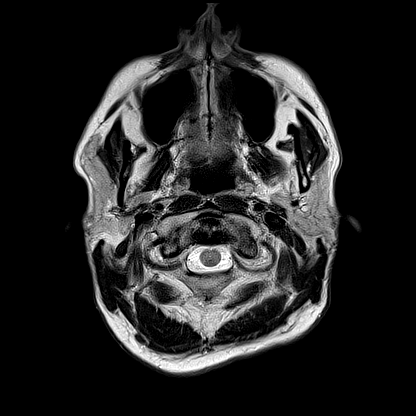
[im 27/27]
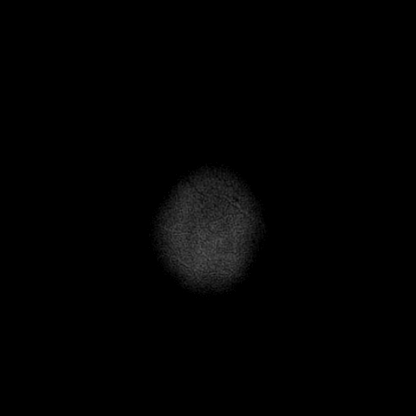

[Series 12: pha_images · axial · 3.0mm · 0.90mm/px · z∈[-13,+156]mm · 3 of 57 slices shown]
[im 1/57]
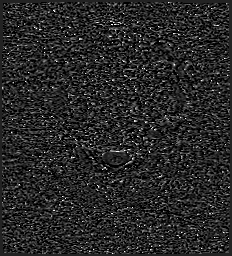
[im 29/57]
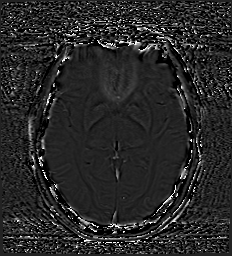
[im 57/57]
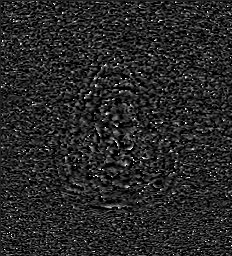

[Series 13: swi_images · axial · 3.0mm · 0.90mm/px · z∈[-13,+162]mm · 4 of 60 slices shown]
[im 1/60]
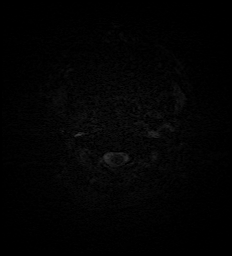
[im 20/60]
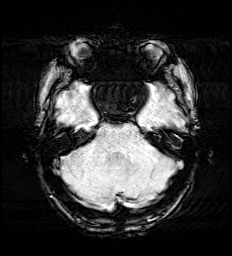
[im 40/60]
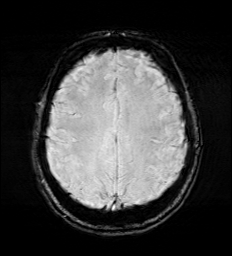
[im 60/60]
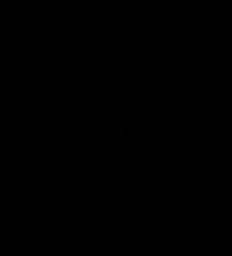

[Series 15: FLAIR · axial · 3.0mm · 0.53mm/px · z∈[-7,+153]mm · 3 of 55 slices shown]
[im 1/55]
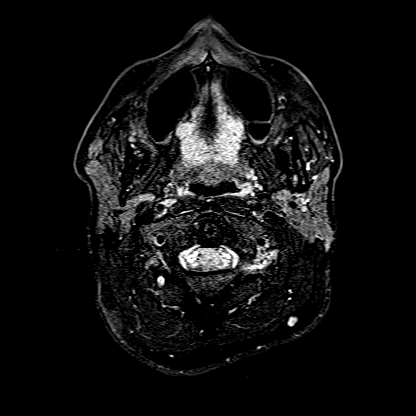
[im 28/55]
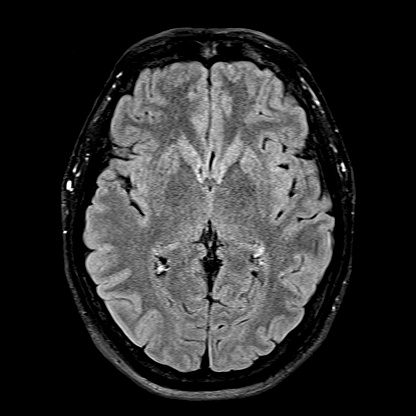
[im 55/55]
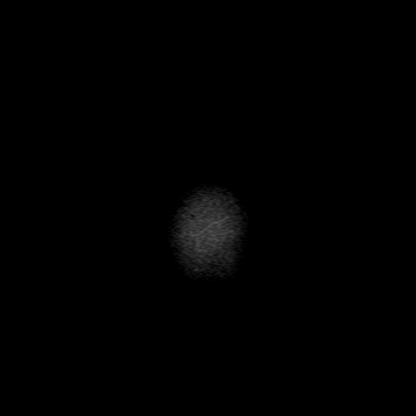

[Series 16: T1 · axial · 1.0mm · 0.98mm/px · z∈[-4,+153]mm · 10 of 160 slices shown (2 of 2)]
[im 1/160]
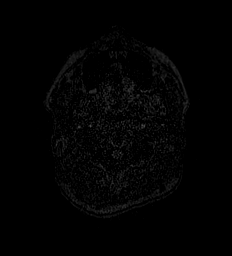
[im 18/160]
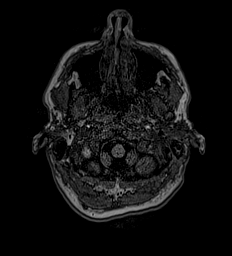
[im 36/160]
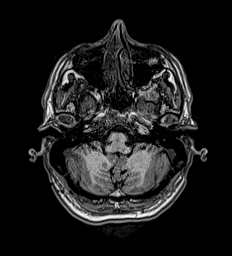
[im 54/160]
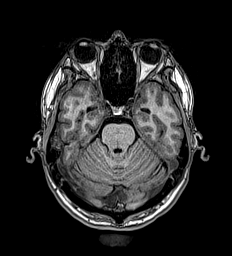
[im 71/160]
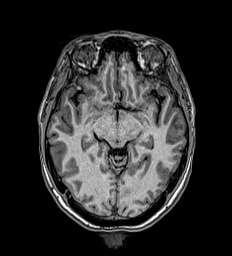
[im 89/160]
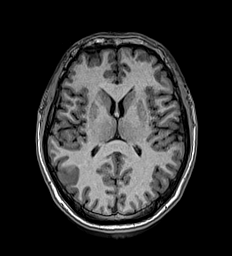
[im 107/160]
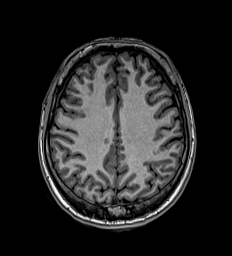
[im 124/160]
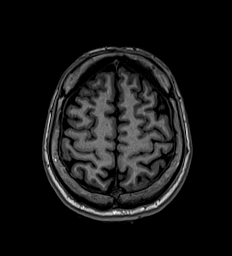
[im 142/160]
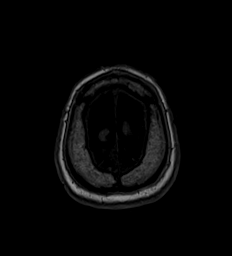
[im 160/160]
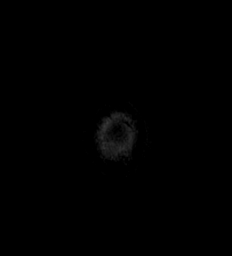

[Series 17: T2 post-contrast · coronal · 5.0mm · 0.57mm/px · 2 of 29 slices shown]
[im 1/29]
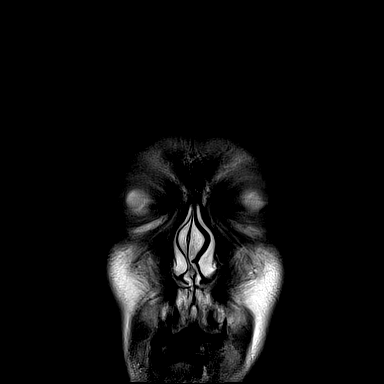
[im 29/29]
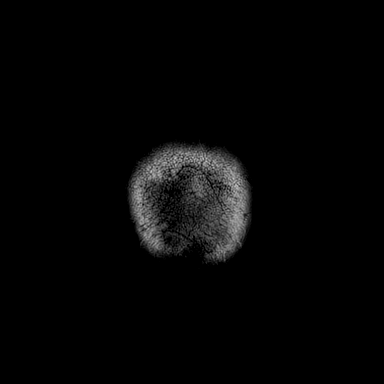

[Series 18: T1 post-contrast · axial · 1.0mm · 0.98mm/px · z∈[-4,+153]mm · 10 of 160 slices shown (1 of 2)]
[im 1/160]
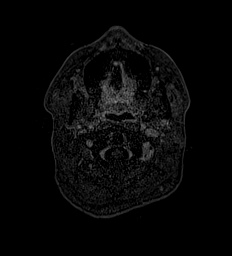
[im 18/160]
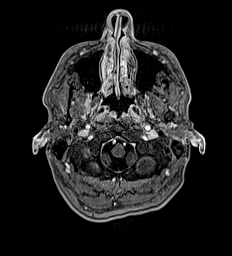
[im 36/160]
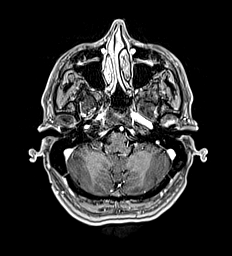
[im 54/160]
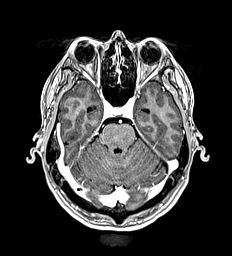
[im 71/160]
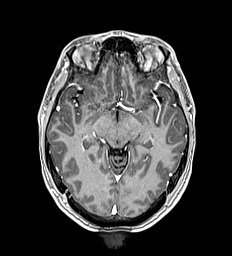
[im 89/160]
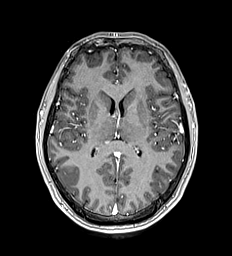
[im 107/160]
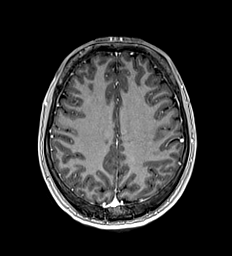
[im 124/160]
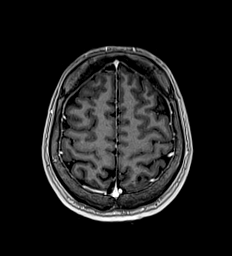
[im 142/160]
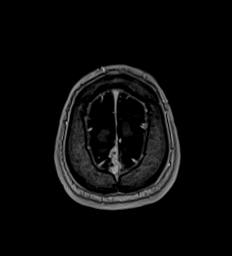
[im 160/160]
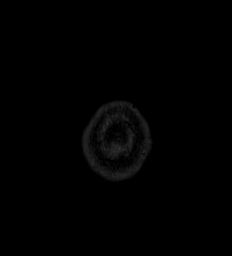

[Series 19: T1 post-contrast · coronal · 5.0mm · 0.57mm/px · 2 of 29 slices shown (2 of 2)]
[im 1/29]
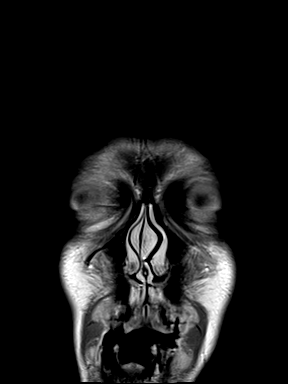
[im 29/29]
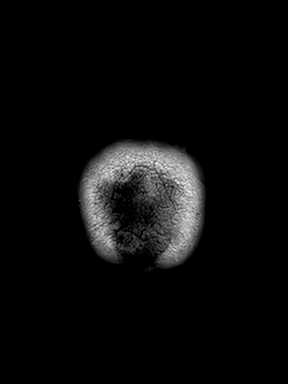

[48 of 48 positions shown; findings below may reference images not displayed]

FINDINGS: Brain: No evidence for acute infarction, hemorrhage, mass lesion,
hydrocephalus, or extra-axial fluid. Normal cerebral volume. No
white matter disease.

Post infusion, no abnormal enhancement of the brain or meninges.

Vascular: Flow voids are maintained throughout the carotid, basilar,
and vertebral arteries. There are no areas of chronic hemorrhage.

Skull and upper cervical spine: Unremarkable visualized calvarium,
skullbase, and cervical vertebrae. Pituitary, pineal, cerebellar
tonsils unremarkable. No upper cervical cord lesions.

Sinuses/Orbits: Negative.

Other: None.
IMPRESSION: Negative exam.

## 2020-05-14 ENCOUNTER — Emergency Department
Admission: EM | Admit: 2020-05-14 | Discharge: 2020-05-14 | Disposition: A | Discharge status: Home / Self Care | Payer: BC Managed Care – PPO | Attending: Emergency Medicine | Admitting: Emergency Medicine

## 2020-05-14 ENCOUNTER — Other Ambulatory Visit: Payer: Self-pay

## 2020-05-14 ENCOUNTER — Encounter: Payer: Self-pay | Admitting: Emergency Medicine

## 2020-05-14 DIAGNOSIS — F17210 Nicotine dependence, cigarettes, uncomplicated: Secondary | ICD-10-CM | POA: Diagnosis not present

## 2020-05-14 DIAGNOSIS — H9203 Otalgia, bilateral: Secondary | ICD-10-CM | POA: Diagnosis present

## 2020-05-14 DIAGNOSIS — H6123 Impacted cerumen, bilateral: Secondary | ICD-10-CM

## 2020-05-14 MED ORDER — CARBAMIDE PEROXIDE 6.5 % OT SOLN
5.0000 [drp] | Freq: Once | OTIC | Status: AC
Start: 2020-05-14 — End: 2020-05-14
  Administered 2020-05-14: 5 [drp] via OTIC
  Filled 2020-05-14: qty 15

## 2020-05-14 NOTE — ED Provider Notes (Signed)
[facility] Emergency Department Provider Note   ____________________________________________   First MD Initiated Contact with Patient 05/14/20 1255     (approximate)  I have reviewed the triage vital signs and the nursing notes.   HISTORY  Chief Complaint Ear Pain   HPI Lucas Cook is a 44 y.o. male presents to the ED with complaint of bilateral ear pain for approximately 4 days.  Patient denies any fever, chills, nausea or vomiting.  He has had no sneezing or upper respiratory symptoms.  Patient obtained some wax remover has been using at home.  He also states that he got a bulb syringe and tried to irrigate his ears without any relief.      History reviewed. No pertinent past medical history.  There are no problems to display for this patient.   History reviewed. No pertinent surgical history.  Prior to Admission medications   Medication Sig Start Date End Date Taking? Authorizing Provider  buPROPion (WELLBUTRIN SR) 150 MG 12 hr tablet Take by mouth. 02/01/17   [provider]    Allergies Patient has no known allergies.  No family history on file.  Social History Social History   Tobacco Use  . Smoking status: Current Every Day Smoker    Types: Cigarettes  . Smokeless tobacco: Never Used  Substance Use Topics  . Alcohol use: Not on file  . Drug use: Not on file    Review of Systems Constitutional: No fever/chills Eyes: No visual changes. ENT: No sore throat.  Positive for ear pain. Cardiovascular: Denies chest pain. Respiratory: Denies shortness of breath. Musculoskeletal: Negative for back pain. Skin: Negative for rash. Neurological: Negative for headaches, focal weakness or numbness. ___________________________________________   PHYSICAL EXAM:  VITAL SIGNS: ED Triage Vitals  Enc Vitals Group     BP 05/14/20 1252 128/78     Pulse Rate 05/14/20 1252 78     Resp --      Temp 05/14/20 1252 98 F (36.7  C)     Temp Source 05/14/20 1252 Oral     SpO2 05/14/20 1252 98 %     Weight 05/14/20 1250 150 lb (68 kg)     Height 05/14/20 1250 5\' 10"  (1.778 m)     Head Circumference --      Peak Flow --      Pain Score 05/14/20 1249 8     Pain Loc --      Pain Edu? --      Excl. in GC? --     Constitutional: Alert and oriented. Well appearing and in no acute distress. Eyes: Conjunctivae are normal.  Head: Atraumatic. Nose: No congestion/rhinnorhea.  Ears: Canals are obstructed with cerumen bilaterally.  No exudate present. Mouth/Throat: Mucous membranes are moist.  Oropharynx non-erythematous. Neck: No stridor.   Cardiovascular: Normal rate, regular rhythm. Grossly normal heart sounds.  Good peripheral circulation. Respiratory: Normal respiratory effort.  No retractions. Lungs CTAB. Neurologic:  Normal speech and language. No gross focal neurologic deficits are appreciated. No gait instability. Skin:  Skin is warm, dry and intact.  Psychiatric: Mood and affect are normal. Speech and behavior are normal.  ____________________________________________   LABS (all labs ordered are listed, but only abnormal results are displayed)  Labs Reviewed - No data to display  PROCEDURES  Procedure(s) performed (including Critical Care):  Procedures   ____________________________________________   INITIAL IMPRESSION / ASSESSMENT AND PLAN / ED COURSE  As part of my medical decision making, I reviewed  the following data within the electronic MEDICAL RECORD NUMBER Notes from prior ED visits and [place] Controlled Substance Database  44 year old male presents to the ED with complaint of bilateral ear pain for the last 4 days.  Patient denies any upper respiratory symptoms, fever, chills or drainage.  Patient has been using over-the-counter drops for cerumen without any relief.  He also used a bulb syringe trying to irrigate his ears without any relief of his ear pain.  On exam canals are completely  obstructed.  Debrox was applied while in the ED and lavaged with moderate amount of cerumen removed.  Patient pain was decreased and he is encouraged to follow-up with [facility] if any continued problems with his ears. ____________________________________________   FINAL CLINICAL IMPRESSION(S) / ED DIAGNOSES  Final diagnoses:  Bilateral impacted cerumen     ED Discharge Orders    None      *Please note:  DELVON CHIPPS was evaluated in Emergency Department on 05/14/2020 for the symptoms described in the history of present illness. He was evaluated in the context of the global COVID-19 pandemic, which necessitated consideration that the patient might be at risk for infection with the SARS-CoV-2 virus that causes COVID-19. Institutional protocols and algorithms that pertain to the evaluation of patients at risk for COVID-19 are in a state of rapid change based on information released by regulatory bodies including the CDC and federal and state organizations. These policies and algorithms were followed during the patient's care in the ED.  Some ED evaluations and interventions may be delayed as a result of limited staffing during and the pandemic.*   Note:  This document was prepared using Dragon voice recognition software and may include unintentional dictation errors.    Tommi Rumps, PA-C 05/14/20 1455    Jene Every, MD 05/14/20 [phone]

## 2020-05-14 NOTE — Discharge Instructions (Signed)
Follow-up with your primary care provider if any continued problems.  You may also make an appointment with [facility]

## 2020-05-14 NOTE — ED Triage Notes (Signed)
Presents with bilateral ear pain since Monday  No fever or drainage

## 2022-08-19 ENCOUNTER — Other Ambulatory Visit: Payer: Self-pay | Admitting: Family

## 2022-08-19 DIAGNOSIS — R1030 Lower abdominal pain, unspecified: Secondary | ICD-10-CM

## 2022-08-25 ENCOUNTER — Ambulatory Visit
Admission: RE | Admit: 2022-08-25 | Discharge: 2022-08-25 | Disposition: A | Discharge status: Home / Self Care | Payer: BC Managed Care – PPO | Source: Ambulatory Visit | Attending: Family | Admitting: Family

## 2022-08-25 DIAGNOSIS — R1030 Lower abdominal pain, unspecified: Secondary | ICD-10-CM | POA: Insufficient documentation

## 2022-10-21 ENCOUNTER — Other Ambulatory Visit: Payer: Self-pay | Admitting: Family

## 2022-12-13 ENCOUNTER — Ambulatory Visit: Payer: BC Managed Care – PPO | Admitting: Family

## 2022-12-13 VITALS — BP 124/62 | HR 75 | Ht 69.0 in | Wt 130.6 lb

## 2022-12-13 DIAGNOSIS — E782 Mixed hyperlipidemia: Secondary | ICD-10-CM

## 2022-12-13 DIAGNOSIS — R5383 Other fatigue: Secondary | ICD-10-CM

## 2022-12-13 DIAGNOSIS — R7303 Prediabetes: Secondary | ICD-10-CM | POA: Diagnosis not present

## 2022-12-13 DIAGNOSIS — E559 Vitamin D deficiency, unspecified: Secondary | ICD-10-CM | POA: Diagnosis not present

## 2022-12-13 DIAGNOSIS — E538 Deficiency of other specified B group vitamins: Secondary | ICD-10-CM

## 2022-12-13 DIAGNOSIS — I10 Essential (primary) hypertension: Secondary | ICD-10-CM

## 2022-12-13 DIAGNOSIS — G4769 Other sleep related movement disorders: Secondary | ICD-10-CM

## 2022-12-13 DIAGNOSIS — K219 Gastro-esophageal reflux disease without esophagitis: Secondary | ICD-10-CM

## 2022-12-13 DIAGNOSIS — R41840 Attention and concentration deficit: Secondary | ICD-10-CM

## 2022-12-14 ENCOUNTER — Ambulatory Visit: Payer: Self-pay | Admitting: Family

## 2022-12-14 ENCOUNTER — Encounter: Payer: Self-pay | Admitting: Family

## 2022-12-14 DIAGNOSIS — E559 Vitamin D deficiency, unspecified: Secondary | ICD-10-CM | POA: Insufficient documentation

## 2022-12-14 DIAGNOSIS — G4769 Other sleep related movement disorders: Secondary | ICD-10-CM | POA: Insufficient documentation

## 2022-12-14 DIAGNOSIS — K219 Gastro-esophageal reflux disease without esophagitis: Secondary | ICD-10-CM | POA: Insufficient documentation

## 2022-12-14 DIAGNOSIS — E782 Mixed hyperlipidemia: Secondary | ICD-10-CM | POA: Insufficient documentation

## 2022-12-14 LAB — LIPID PANEL
Chol/HDL Ratio: 5 ratio (ref 0.0–5.0)
Cholesterol, Total: 169 mg/dL (ref 100–199)
HDL: 34 mg/dL — ABNORMAL LOW (ref >39)
LDL Chol Calc (NIH): 116 mg/dL — ABNORMAL HIGH (ref 0–99)
Triglycerides: 102 mg/dL (ref 0–149)
VLDL Cholesterol Cal: 19 mg/dL (ref 5–40)

## 2022-12-14 LAB — CMP14+EGFR
ALT: 17 IU/L (ref 0–44)
AST: 22 IU/L (ref 0–40)
Albumin/Globulin Ratio: 2.3 — ABNORMAL HIGH (ref 1.2–2.2)
Albumin: 4.8 g/dL (ref 4.1–5.1)
Alkaline Phosphatase: 80 IU/L (ref 44–121)
BUN/Creatinine Ratio: 11 (ref 9–20)
BUN: 9 mg/dL (ref 6–24)
Bilirubin Total: <0.2 mg/dL (ref 0.0–1.2)
CO2: 21 mmol/L (ref 20–29)
Calcium: 9.7 mg/dL (ref 8.7–10.2)
Chloride: 104 mmol/L (ref 96–106)
Creatinine, Ser: 0.84 mg/dL (ref 0.76–1.27)
Globulin, Total: 2.1 g/dL (ref 1.5–4.5)
Glucose: 51 mg/dL — ABNORMAL LOW (ref 70–99)
Potassium: 4.1 mmol/L (ref 3.5–5.2)
Sodium: 140 mmol/L (ref 134–144)
Total Protein: 6.9 g/dL (ref 6.0–8.5)
eGFR: 110 mL/min/{1.73_m2} (ref >59)

## 2022-12-14 LAB — CBC WITH DIFFERENTIAL
Basophils Absolute: 0.1 10*3/uL (ref 0.0–0.2)
Basos: 1 %
EOS (ABSOLUTE): 0.1 10*3/uL (ref 0.0–0.4)
Eos: 2 %
Hematocrit: 47 % (ref 37.5–51.0)
Hemoglobin: 15.8 g/dL (ref 13.0–17.7)
Immature Grans (Abs): 0 10*3/uL (ref 0.0–0.1)
Immature Granulocytes: 0 %
Lymphocytes Absolute: 2.3 10*3/uL (ref 0.7–3.1)
Lymphs: 34 %
MCH: 31.4 pg (ref 26.6–33.0)
MCHC: 33.6 g/dL (ref 31.5–35.7)
MCV: 93 fL (ref 79–97)
Monocytes Absolute: 0.6 10*3/uL (ref 0.1–0.9)
Monocytes: 9 %
Neutrophils Absolute: 3.6 10*3/uL (ref 1.4–7.0)
Neutrophils: 54 %
RBC: 5.03 x10E6/uL (ref 4.14–5.80)
RDW: 12.7 % (ref 11.6–15.4)
WBC: 6.7 10*3/uL (ref 3.4–10.8)

## 2022-12-14 LAB — VITAMIN B12: Vitamin B-12: 606 pg/mL (ref 232–1245)

## 2022-12-14 LAB — HEMOGLOBIN A1C
Est. average glucose Bld gHb Est-mCnc: 103 mg/dL
Hgb A1c MFr Bld: 5.2 % (ref 4.8–5.6)

## 2022-12-14 LAB — TSH: TSH: 1.75 u[IU]/mL (ref 0.450–4.500)

## 2022-12-14 LAB — VITAMIN D 25 HYDROXY (VIT D DEFICIENCY, FRACTURES): Vit D, 25-Hydroxy: 85.3 ng/mL (ref 30.0–100.0)

## 2022-12-14 NOTE — Progress Notes (Signed)
Established Patient Office Visit  Subjective:  Patient ID: Lucas Cook, male    DOB: Aug 03, 1976  Age: 47 y.o. MRN: [id number]  Chief Complaint  Patient presents with   Follow-up    3 month follow up    Patient is here today for his 3 months follow up.  He has been feeling fairly well since last appointment.   He does not have additional concerns to discuss today.  Labs are due today. He needs refills.   I have reviewed his active problem list, medication list, allergies, family history, notes from last encounter, lab results for his appointment today.    No other concerns at this time.   No past medical history on file.  No past surgical history on file.  Social History   Socioeconomic History   Marital status: Married    Spouse name: Not on file   Number of children: Not on file   Years of education: Not on file   Highest education level: Not on file  Occupational History   Not on file  Tobacco Use   Smoking status: Every Day    Types: Cigarettes   Smokeless tobacco: Never  Substance and Sexual Activity   Alcohol use: Not on file   Drug use: Not on file   Sexual activity: Not on file  Other Topics Concern   Not on file  Social History Narrative   Not on file   Social Determinants of Health   Financial Resource Strain: Not on file  Food Insecurity: Not on file  Transportation Needs: Not on file  Physical Activity: Not on file  Stress: Not on file  Social Connections: Not on file  Intimate Partner Violence: Not on file    No family history on file.  No Known Allergies  Review of Systems  All other systems reviewed and are negative.      Objective:   BP 124/62   Pulse 75   Ht  (1.753 m)   Wt 130 lb 9.6 oz (59.2 kg)   SpO2 98%   BMI 19.29 kg/m   Vitals:   12/13/22 1455  BP: 124/62  Pulse: 75  Height:  (1.753 m)  Weight: 130 lb 9.6 oz (59.2 kg)  SpO2: 98%  BMI (Calculated): 19.28    Physical Exam Vitals and  nursing note reviewed.  Constitutional:      Appearance: Normal appearance. He is normal weight.  Eyes:     Pupils: Pupils are equal, round, and reactive to light.  Cardiovascular:     Rate and Rhythm: Normal rate and regular rhythm.     Pulses: Normal pulses.     Heart sounds: Normal heart sounds.  Pulmonary:     Effort: Pulmonary effort is normal.     Breath sounds: Normal breath sounds.  Neurological:     Mental Status: He is alert.  Psychiatric:        Mood and Affect: Mood normal.        Behavior: Behavior normal.      Results for orders placed or performed in visit on 12/13/22  Lipid panel  Result Value Ref Range   Cholesterol, Total 169 100 - 199 mg/dL   Triglycerides 119 0 - 149 mg/dL   HDL 34 (L) >14 mg/dL   VLDL Cholesterol Cal 19 5 - 40 mg/dL   LDL Chol Calc (NIH) 782 (H) 0 - 99 mg/dL   Chol/HDL Ratio 5.0 0.0 - 5.0 ratio  VITAMIN D  25 Hydroxy (Vit-D Deficiency, Fractures)  Result Value Ref Range   Vit D, 25-Hydroxy 85.3 30.0 - 100.0 ng/mL  CBC With Differential  Result Value Ref Range   WBC 6.7 3.4 - 10.8 x10E3/uL   RBC 5.03 4.14 - 5.80 x10E6/uL   Hemoglobin 15.8 13.0 - 17.7 g/dL   Hematocrit 40.9 81.1 - 51.0 %   MCV 93 79 - 97 fL   MCH 31.4 26.6 - 33.0 pg   MCHC 33.6 31.5 - 35.7 g/dL   RDW 91.4 78.2 - 95.6 %   Neutrophils 54 Not Estab. %   Lymphs 34 Not Estab. %   Monocytes 9 Not Estab. %   Eos 2 Not Estab. %   Basos 1 Not Estab. %   Neutrophils Absolute 3.6 1.4 - 7.0 x10E3/uL   Lymphocytes Absolute 2.3 0.7 - 3.1 x10E3/uL   Monocytes Absolute 0.6 0.1 - 0.9 x10E3/uL   EOS (ABSOLUTE) 0.1 0.0 - 0.4 x10E3/uL   Basophils Absolute 0.1 0.0 - 0.2 x10E3/uL   Immature Granulocytes 0 Not Estab. %   Immature Grans (Abs) 0.0 0.0 - 0.1 x10E3/uL  CMP14+EGFR  Result Value Ref Range   Glucose 51 (L) 70 - 99 mg/dL   BUN 9 6 - 24 mg/dL   Creatinine, Ser 2.13 0.76 - 1.27 mg/dL   eGFR 086 >57 QI/ONG/2.95   BUN/Creatinine Ratio 11 9 - 20   Sodium 140 134 - 144  mmol/L   Potassium 4.1 3.5 - 5.2 mmol/L   Chloride 104 96 - 106 mmol/L   CO2 21 20 - 29 mmol/L   Calcium 9.7 8.7 - 10.2 mg/dL   Total Protein 6.9 6.0 - 8.5 g/dL   Albumin 4.8 4.1 - 5.1 g/dL   Globulin, Total 2.1 1.5 - 4.5 g/dL   Albumin/Globulin Ratio 2.3 (H) 1.2 - 2.2   Bilirubin Total <0.2 0.0 - 1.2 mg/dL   Alkaline Phosphatase 80 44 - 121 IU/L   AST 22 0 - 40 IU/L   ALT 17 0 - 44 IU/L  TSH  Result Value Ref Range   TSH 1.750 0.450 - 4.500 uIU/mL  Hemoglobin A1c  Result Value Ref Range   Hgb A1c MFr Bld 5.2 4.8 - 5.6 %   Est. average glucose Bld gHb Est-mCnc 103 mg/dL  Vitamin M84  Result Value Ref Range   Vitamin B-12 606 232 - 1,245 pg/mL    Recent Results (from the past 2160 hour(s))  Lipid panel     Status: Abnormal   Collection Time: 12/13/22  3:46 PM  Result Value Ref Range   Cholesterol, Total 169 100 - 199 mg/dL   Triglycerides 132 0 - 149 mg/dL   HDL 34 (L) >44 mg/dL   VLDL Cholesterol Cal 19 5 - 40 mg/dL   LDL Chol Calc (NIH) 010 (H) 0 - 99 mg/dL   Chol/HDL Ratio 5.0 0.0 - 5.0 ratio    Comment:                                   T. Chol/HDL Ratio                                             Men  Women  1/2 Avg.Risk  3.4    3.3                                   Avg.Risk  5.0    4.4                                2X Avg.Risk  9.6    7.1                                3X Avg.Risk 23.4   11.0   VITAMIN D 25 Hydroxy (Vit-D Deficiency, Fractures)     Status: None   Collection Time: 12/13/22  3:46 PM  Result Value Ref Range   Vit D, 25-Hydroxy 85.3 30.0 - 100.0 ng/mL    Comment: Vitamin D deficiency has been defined by the Institute of Medicine and an Endocrine Society practice guideline as a level of serum 25-OH vitamin D less than 20 ng/mL (1,2). The Endocrine Society went on to further define vitamin D insufficiency as a level between 21 and 29 ng/mL (2). 1. IOM (Institute of Medicine). 2010. Dietary reference    intakes for  calcium and D. [place]: The    [employer]. 2. Holick MF, Binkley [place], Bischoff-Ferrari HA, et al.    Evaluation, treatment, and prevention of vitamin D    deficiency: an Endocrine Society clinical practice    guideline. JCEM. 2011 Jul; 96(7):1911-30.   CBC With Differential     Status: None   Collection Time: 12/13/22  3:46 PM  Result Value Ref Range   WBC 6.7 3.4 - 10.8 x10E3/uL   RBC 5.03 4.14 - 5.80 x10E6/uL   Hemoglobin 15.8 13.0 - 17.7 g/dL   Hematocrit 09.6 04.5 - 51.0 %   MCV 93 79 - 97 fL   MCH 31.4 26.6 - 33.0 pg   MCHC 33.6 31.5 - 35.7 g/dL   RDW 40.9 81.1 - 91.4 %   Neutrophils 54 Not Estab. %   Lymphs 34 Not Estab. %   Monocytes 9 Not Estab. %   Eos 2 Not Estab. %   Basos 1 Not Estab. %   Neutrophils Absolute 3.6 1.4 - 7.0 x10E3/uL   Lymphocytes Absolute 2.3 0.7 - 3.1 x10E3/uL   Monocytes Absolute 0.6 0.1 - 0.9 x10E3/uL   EOS (ABSOLUTE) 0.1 0.0 - 0.4 x10E3/uL   Basophils Absolute 0.1 0.0 - 0.2 x10E3/uL   Immature Granulocytes 0 Not Estab. %   Immature Grans (Abs) 0.0 0.0 - 0.1 x10E3/uL  CMP14+EGFR     Status: Abnormal   Collection Time: 12/13/22  3:46 PM  Result Value Ref Range   Glucose 51 (L) 70 - 99 mg/dL   BUN 9 6 - 24 mg/dL   Creatinine, Ser 7.82 0.76 - 1.27 mg/dL   eGFR 956 >21 HY/QMV/7.84   BUN/Creatinine Ratio 11 9 - 20   Sodium 140 134 - 144 mmol/L   Potassium 4.1 3.5 - 5.2 mmol/L   Chloride 104 96 - 106 mmol/L   CO2 21 20 - 29 mmol/L   Calcium 9.7 8.7 - 10.2 mg/dL   Total Protein 6.9 6.0 - 8.5 g/dL   Albumin 4.8 4.1 - 5.1 g/dL   Globulin, Total 2.1 1.5 - 4.5 g/dL   Albumin/Globulin Ratio 2.3 (H) 1.2 -  2.2   Bilirubin Total <0.2 0.0 - 1.2 mg/dL   Alkaline Phosphatase 80 44 - 121 IU/L   AST 22 0 - 40 IU/L   ALT 17 0 - 44 IU/L  TSH     Status: None   Collection Time: 12/13/22  3:46 PM  Result Value Ref Range   TSH 1.750 0.450 - 4.500 uIU/mL  Hemoglobin A1c     Status: None   Collection Time: 12/13/22  3:46 PM  Result Value  Ref Range   Hgb A1c MFr Bld 5.2 4.8 - 5.6 %    Comment:          Prediabetes: 5.7 - 6.4          Diabetes: >6.4          Glycemic control for adults with diabetes: <7.0    Est. average glucose Bld gHb Est-mCnc 103 mg/dL  Vitamin U98     Status: None   Collection Time: 12/13/22  3:46 PM  Result Value Ref Range   Vitamin B-12 606 232 - 1,245 pg/mL       Assessment & Plan:   Problem List Items Addressed This Visit     Mixed hyperlipidemia - Primary    Checking labs today.  Continue current therapy for lipid control. Will modify as needed based on labwork results.        Relevant Medications   rosuvastatin (CRESTOR) 10 MG tablet   Other Relevant Orders   Lipid panel (Completed)   CBC With Differential (Completed)   CMP14+EGFR (Completed)   Gastroesophageal reflux disease without esophagitis   Vitamin D deficiency, unspecified   Relevant Orders   VITAMIN D 25 Hydroxy (Vit-D Deficiency, Fractures) (Completed)   CBC With Differential (Completed)   CMP14+EGFR (Completed)   Sleep-related movement disorder   Other Visit Diagnoses     Prediabetes       Relevant Orders   CBC With Differential (Completed)   CMP14+EGFR (Completed)   Hemoglobin A1c (Completed)   Essential hypertension, benign       Relevant Medications   rosuvastatin (CRESTOR) 10 MG tablet   Other Relevant Orders   CBC With Differential (Completed)   CMP14+EGFR (Completed)   B12 deficiency due to diet       Relevant Orders   CBC With Differential (Completed)   CMP14+EGFR (Completed)   Vitamin B12 (Completed)   Other fatigue       Relevant Orders   CBC With Differential (Completed)   CMP14+EGFR (Completed)   TSH (Completed)   Attention and concentration deficit       sending referral to [facility].       Return in about 3 months (around 03/14/2023) for F/U.   Total time spent: 20 minutes  Miki Kins, FNP  12/13/2022

## 2022-12-14 NOTE — Assessment & Plan Note (Signed)
Checking labs today.  Continue current therapy for lipid control. Will modify as needed based on labwork results.  

## 2023-01-05 ENCOUNTER — Telehealth: Payer: Self-pay

## 2023-01-05 NOTE — Telephone Encounter (Signed)
Tonya called and left vm regarding pt stopping cholesterol rx, since he stopped taking rx he has had no leg pain. Asked about an alternate rx for cholesterol? Please advise

## 2023-01-10 ENCOUNTER — Other Ambulatory Visit: Payer: Self-pay | Admitting: Family

## 2023-01-20 ENCOUNTER — Ambulatory Visit: Payer: BC Managed Care – PPO | Admitting: Family

## 2023-01-27 ENCOUNTER — Other Ambulatory Visit: Payer: Self-pay | Admitting: Family

## 2023-01-30 ENCOUNTER — Other Ambulatory Visit: Payer: Self-pay

## 2023-03-14 ENCOUNTER — Encounter: Payer: Self-pay | Admitting: Family

## 2023-03-14 ENCOUNTER — Ambulatory Visit: Payer: BC Managed Care – PPO | Admitting: Family

## 2023-03-14 VITALS — BP 86/54 | HR 65 | Ht 69.0 in | Wt 133.2 lb

## 2023-03-14 DIAGNOSIS — E782 Mixed hyperlipidemia: Secondary | ICD-10-CM

## 2023-03-14 DIAGNOSIS — R5383 Other fatigue: Secondary | ICD-10-CM

## 2023-03-14 DIAGNOSIS — R252 Cramp and spasm: Secondary | ICD-10-CM

## 2023-03-14 DIAGNOSIS — G4769 Other sleep related movement disorders: Secondary | ICD-10-CM

## 2023-03-14 DIAGNOSIS — E559 Vitamin D deficiency, unspecified: Secondary | ICD-10-CM | POA: Diagnosis not present

## 2023-03-14 DIAGNOSIS — E538 Deficiency of other specified B group vitamins: Secondary | ICD-10-CM

## 2023-03-14 DIAGNOSIS — K219 Gastro-esophageal reflux disease without esophagitis: Secondary | ICD-10-CM

## 2023-03-14 DIAGNOSIS — R7303 Prediabetes: Secondary | ICD-10-CM | POA: Diagnosis not present

## 2023-03-14 DIAGNOSIS — G4719 Other hypersomnia: Secondary | ICD-10-CM

## 2023-03-15 LAB — CMP14+EGFR
ALT: 10 IU/L (ref 0–44)
AST: 20 IU/L (ref 0–40)
Albumin: 4.7 g/dL (ref 4.1–5.1)
Alkaline Phosphatase: 78 IU/L (ref 44–121)
BUN/Creatinine Ratio: 13 (ref 9–20)
BUN: 13 mg/dL (ref 6–24)
Bilirubin Total: <0.2 mg/dL (ref 0.0–1.2)
CO2: 18 mmol/L — ABNORMAL LOW (ref 20–29)
Calcium: 9.5 mg/dL (ref 8.7–10.2)
Chloride: 105 mmol/L (ref 96–106)
Creatinine, Ser: 0.98 mg/dL (ref 0.76–1.27)
Globulin, Total: 2.2 g/dL (ref 1.5–4.5)
Glucose: 76 mg/dL (ref 70–99)
Potassium: 4 mmol/L (ref 3.5–5.2)
Sodium: 138 mmol/L (ref 134–144)
Total Protein: 6.9 g/dL (ref 6.0–8.5)
eGFR: 97 mL/min/{1.73_m2} (ref >59)

## 2023-03-15 LAB — CBC WITH DIFFERENTIAL
Basophils Absolute: 0.1 10*3/uL (ref 0.0–0.2)
Basos: 1 %
EOS (ABSOLUTE): 0.1 10*3/uL (ref 0.0–0.4)
Eos: 2 %
Hematocrit: 46.2 % (ref 37.5–51.0)
Hemoglobin: 15.8 g/dL (ref 13.0–17.7)
Immature Grans (Abs): 0 10*3/uL (ref 0.0–0.1)
Immature Granulocytes: 1 %
Lymphocytes Absolute: 2.3 10*3/uL (ref 0.7–3.1)
Lymphs: 29 %
MCH: 32 pg (ref 26.6–33.0)
MCHC: 34.2 g/dL (ref 31.5–35.7)
MCV: 94 fL (ref 79–97)
Monocytes Absolute: 0.7 10*3/uL (ref 0.1–0.9)
Monocytes: 8 %
Neutrophils Absolute: 4.7 10*3/uL (ref 1.4–7.0)
Neutrophils: 59 %
RBC: 4.93 x10E6/uL (ref 4.14–5.80)
RDW: 12.6 % (ref 11.6–15.4)
WBC: 7.9 10*3/uL (ref 3.4–10.8)

## 2023-03-15 LAB — HEMOGLOBIN A1C
Est. average glucose Bld gHb Est-mCnc: 100 mg/dL
Hgb A1c MFr Bld: 5.1 % (ref 4.8–5.6)

## 2023-03-15 LAB — VITAMIN D 25 HYDROXY (VIT D DEFICIENCY, FRACTURES): Vit D, 25-Hydroxy: 78.8 ng/mL (ref 30.0–100.0)

## 2023-03-15 LAB — LIPID PANEL
Chol/HDL Ratio: 7.8 ratio — ABNORMAL HIGH (ref 0.0–5.0)
Cholesterol, Total: 259 mg/dL — ABNORMAL HIGH (ref 100–199)
HDL: 33 mg/dL — ABNORMAL LOW (ref >39)
LDL Chol Calc (NIH): 203 mg/dL — ABNORMAL HIGH (ref 0–99)
Triglycerides: 127 mg/dL (ref 0–149)
VLDL Cholesterol Cal: 23 mg/dL (ref 5–40)

## 2023-03-15 LAB — MAGNESIUM: Magnesium: 2.2 mg/dL (ref 1.6–2.3)

## 2023-03-15 LAB — VITAMIN B12: Vitamin B-12: 582 pg/mL (ref 232–1245)

## 2023-03-15 LAB — TSH: TSH: 1.34 u[IU]/mL (ref 0.450–4.500)

## 2023-03-25 ENCOUNTER — Encounter: Payer: Self-pay | Admitting: Family

## 2023-03-25 DIAGNOSIS — R252 Cramp and spasm: Secondary | ICD-10-CM | POA: Insufficient documentation

## 2023-03-25 NOTE — Assessment & Plan Note (Signed)
Patient stable.  Well controlled with current therapy.   Continue current meds.  

## 2023-03-25 NOTE — Assessment & Plan Note (Signed)
Checking labs today.  Continue current therapy for lipid control. Will modify as needed based on labwork results.  

## 2023-03-25 NOTE — Assessment & Plan Note (Signed)
Checking labs today.  Will continue supplements as needed.  

## 2023-03-25 NOTE — Progress Notes (Signed)
Established Patient Office Visit  Subjective:  Patient ID: Lucas Cook, male    DOB: 08-Sep-1975  Age: 47 y.o. MRN: [id number]  Chief Complaint  Patient presents with   leg cramps     Patient is here today for his 3 months follow up.  He has been feeling fairly well since last appointment.   He does have additional concerns to discuss today.  He is having significant amounts of fatigue, falling asleep in office today.  Still needs sleep study, and has been having additional trouble with cramping in his legs.   Labs are due today. He needs refills.   I have reviewed his active problem list, medication list, allergies, notes from last encounter, lab results for his appointment today.    No other concerns at this time.   History reviewed. No pertinent past medical history.  History reviewed. No pertinent surgical history.  Social History   Socioeconomic History   Marital status: Married    Spouse name: Not on file   Number of children: Not on file   Years of education: Not on file   Highest education level: Not on file  Occupational History   Not on file  Tobacco Use   Smoking status: Every Day    Types: Cigarettes   Smokeless tobacco: Never  Substance and Sexual Activity   Alcohol use: Not on file   Drug use: Not on file   Sexual activity: Not on file  Other Topics Concern   Not on file  Social History Narrative   Not on file   Social Determinants of Health   Financial Resource Strain: Not on file  Food Insecurity: Not on file  Transportation Needs: Not on file  Physical Activity: Not on file  Stress: Not on file  Social Connections: Not on file  Intimate Partner Violence: Not on file    History reviewed. No pertinent family history.  No Known Allergies  Review of Systems  Constitutional:  Positive for malaise/fatigue.  Musculoskeletal:  Positive for myalgias.  All other systems reviewed and are negative.      Objective:   BP (!)  86/54   Pulse 65   Ht 5\' 9"  (1.753 m)   Wt 133 lb 3.2 oz (60.4 kg)   SpO2 97%   BMI 19.67 kg/m   Vitals:   03/14/23 1514  BP: (!) 86/54  Pulse: 65  Height: 5\' 9"  (1.753 m)  Weight: 133 lb 3.2 oz (60.4 kg)  SpO2: 97%  BMI (Calculated): 19.66    Physical Exam Vitals and nursing note reviewed.  Constitutional:      Appearance: Normal appearance. He is normal weight.  Eyes:     Extraocular Movements: Extraocular movements intact.     Conjunctiva/sclera: Conjunctivae normal.     Pupils: Pupils are equal, round, and reactive to light.  Cardiovascular:     Rate and Rhythm: Normal rate and regular rhythm.     Pulses: Normal pulses.     Heart sounds: Normal heart sounds.  Pulmonary:     Effort: Pulmonary effort is normal.     Breath sounds: Normal breath sounds.  Musculoskeletal:        General: Normal range of motion.  Neurological:     General: No focal deficit present.     Mental Status: He is alert and oriented to person, place, and time. Mental status is at baseline.  Psychiatric:        Mood and Affect: Mood normal.  Behavior: Behavior normal.        Thought Content: Thought content normal.        Judgment: Judgment normal.      Results for orders placed or performed in visit on 03/14/23  Lipid panel  Result Value Ref Range   Cholesterol, Total 259 (H) 100 - 199 mg/dL   Triglycerides 161 0 - 149 mg/dL   HDL 33 (L) >09 mg/dL   VLDL Cholesterol Cal 23 5 - 40 mg/dL   LDL Chol Calc (NIH) 604 (H) 0 - 99 mg/dL   LDL CALC COMMENT: Comment    Chol/HDL Ratio 7.8 (H) 0.0 - 5.0 ratio  VITAMIN D 25 Hydroxy (Vit-D Deficiency, Fractures)  Result Value Ref Range   Vit D, 25-Hydroxy 78.8 30.0 - 100.0 ng/mL  CBC With Differential  Result Value Ref Range   WBC 7.9 3.4 - 10.8 x10E3/uL   RBC 4.93 4.14 - 5.80 x10E6/uL   Hemoglobin 15.8 13.0 - 17.7 g/dL   Hematocrit 54.0 98.1 - 51.0 %   MCV 94 79 - 97 fL   MCH 32.0 26.6 - 33.0 pg   MCHC 34.2 31.5 - 35.7 g/dL   RDW  19.1 47.8 - 29.5 %   Neutrophils 59 Not Estab. %   Lymphs 29 Not Estab. %   Monocytes 8 Not Estab. %   Eos 2 Not Estab. %   Basos 1 Not Estab. %   Neutrophils Absolute 4.7 1.4 - 7.0 x10E3/uL   Lymphocytes Absolute 2.3 0.7 - 3.1 x10E3/uL   Monocytes Absolute 0.7 0.1 - 0.9 x10E3/uL   EOS (ABSOLUTE) 0.1 0.0 - 0.4 x10E3/uL   Basophils Absolute 0.1 0.0 - 0.2 x10E3/uL   Immature Granulocytes 1 Not Estab. %   Immature Grans (Abs) 0.0 0.0 - 0.1 x10E3/uL  CMP14+EGFR  Result Value Ref Range   Glucose 76 70 - 99 mg/dL   BUN 13 6 - 24 mg/dL   Creatinine, Ser 6.21 0.76 - 1.27 mg/dL   eGFR 97 >30 QM/VHQ/4.69   BUN/Creatinine Ratio 13 9 - 20   Sodium 138 134 - 144 mmol/L   Potassium 4.0 3.5 - 5.2 mmol/L   Chloride 105 96 - 106 mmol/L   CO2 18 (L) 20 - 29 mmol/L   Calcium 9.5 8.7 - 10.2 mg/dL   Total Protein 6.9 6.0 - 8.5 g/dL   Albumin 4.7 4.1 - 5.1 g/dL   Globulin, Total 2.2 1.5 - 4.5 g/dL   Bilirubin Total <6.2 0.0 - 1.2 mg/dL   Alkaline Phosphatase 78 44 - 121 IU/L   AST 20 0 - 40 IU/L   ALT 10 0 - 44 IU/L  TSH  Result Value Ref Range   TSH 1.340 0.450 - 4.500 uIU/mL  Hemoglobin A1c  Result Value Ref Range   Hgb A1c MFr Bld 5.1 4.8 - 5.6 %   Est. average glucose Bld gHb Est-mCnc 100 mg/dL  Vitamin X52  Result Value Ref Range   Vitamin B-12 582 232 - 1,245 pg/mL  Magnesium  Result Value Ref Range   Magnesium 2.2 1.6 - 2.3 mg/dL    Recent Results (from the past 2160 hour(s))  Lipid panel     Status: Abnormal   Collection Time: 03/14/23  3:53 PM  Result Value Ref Range   Cholesterol, Total 259 (H) 100 - 199 mg/dL   Triglycerides 841 0 - 149 mg/dL   HDL 33 (L) >32 mg/dL   VLDL Cholesterol Cal 23 5 - 40 mg/dL   LDL  Chol Calc (NIH) 203 (H) 0 - 99 mg/dL   LDL CALC COMMENT: Comment     Comment: Consider evaluating for Familial Hypercholesterolemia(FH), if clinically indicated.    Chol/HDL Ratio 7.8 (H) 0.0 - 5.0 ratio    Comment:                                   T. Chol/HDL  Ratio                                             Men  Women                               1/2 Avg.Risk  3.4    3.3                                   Avg.Risk  5.0    4.4                                2X Avg.Risk  9.6    7.1                                3X Avg.Risk 23.4   11.0   VITAMIN D 25 Hydroxy (Vit-D Deficiency, Fractures)     Status: None   Collection Time: 03/14/23  3:53 PM  Result Value Ref Range   Vit D, 25-Hydroxy 78.8 30.0 - 100.0 ng/mL    Comment: Vitamin D deficiency has been defined by the Institute of Medicine and an Endocrine Society practice guideline as a level of serum 25-OH vitamin D less than 20 ng/mL (1,2). The Endocrine Society went on to further define vitamin D insufficiency as a level between 21 and 29 ng/mL (2). 1. IOM (Institute of Medicine). 2010. Dietary reference    intakes for calcium and D. [place]: The    [employer]. 2. Holick MF, Binkley [place], Bischoff-Ferrari HA, et al.    Evaluation, treatment, and prevention of vitamin D    deficiency: an Endocrine Society clinical practice    guideline. JCEM. 2011 Jul; 96(7):1911-30.   CBC With Differential     Status: None   Collection Time: 03/14/23  3:53 PM  Result Value Ref Range   WBC 7.9 3.4 - 10.8 x10E3/uL   RBC 4.93 4.14 - 5.80 x10E6/uL   Hemoglobin 15.8 13.0 - 17.7 g/dL   Hematocrit 16.1 09.6 - 51.0 %   MCV 94 79 - 97 fL   MCH 32.0 26.6 - 33.0 pg   MCHC 34.2 31.5 - 35.7 g/dL   RDW 04.5 40.9 - 81.1 %   Neutrophils 59 Not Estab. %   Lymphs 29 Not Estab. %   Monocytes 8 Not Estab. %   Eos 2 Not Estab. %   Basos 1 Not Estab. %   Neutrophils Absolute 4.7 1.4 - 7.0 x10E3/uL   Lymphocytes Absolute 2.3 0.7 - 3.1 x10E3/uL   Monocytes Absolute 0.7 0.1 - 0.9 x10E3/uL   EOS (ABSOLUTE) 0.1 0.0 - 0.4 x10E3/uL   Basophils Absolute 0.1  0.0 - 0.2 x10E3/uL   Immature Granulocytes 1 Not Estab. %   Immature Grans (Abs) 0.0 0.0 - 0.1 x10E3/uL    Comment: **Effective March 27, 2023, profile  [id number] CBC/Differential**   (No Platelet) will be made non-orderable. [facility] Offers:   [id number] CBC With Differential/Platelet   CMP14+EGFR     Status: Abnormal   Collection Time: 03/14/23  3:53 PM  Result Value Ref Range   Glucose 76 70 - 99 mg/dL   BUN 13 6 - 24 mg/dL   Creatinine, Ser 0.98 0.76 - 1.27 mg/dL   eGFR 97 >11 BJ/YNW/2.95   BUN/Creatinine Ratio 13 9 - 20   Sodium 138 134 - 144 mmol/L   Potassium 4.0 3.5 - 5.2 mmol/L   Chloride 105 96 - 106 mmol/L   CO2 18 (L) 20 - 29 mmol/L   Calcium 9.5 8.7 - 10.2 mg/dL   Total Protein 6.9 6.0 - 8.5 g/dL   Albumin 4.7 4.1 - 5.1 g/dL   Globulin, Total 2.2 1.5 - 4.5 g/dL   Bilirubin Total <6.2 0.0 - 1.2 mg/dL   Alkaline Phosphatase 78 44 - 121 IU/L   AST 20 0 - 40 IU/L   ALT 10 0 - 44 IU/L  TSH     Status: None   Collection Time: 03/14/23  3:53 PM  Result Value Ref Range   TSH 1.340 0.450 - 4.500 uIU/mL  Hemoglobin A1c     Status: None   Collection Time: 03/14/23  3:53 PM  Result Value Ref Range   Hgb A1c MFr Bld 5.1 4.8 - 5.6 %    Comment:          Prediabetes: 5.7 - 6.4          Diabetes: >6.4          Glycemic control for adults with diabetes: <7.0    Est. average glucose Bld gHb Est-mCnc 100 mg/dL  Vitamin Z30     Status: None   Collection Time: 03/14/23  3:53 PM  Result Value Ref Range   Vitamin B-12 582 232 - 1,245 pg/mL  Magnesium     Status: None   Collection Time: 03/14/23  3:53 PM  Result Value Ref Range   Magnesium 2.2 1.6 - 2.3 mg/dL       Assessment & Plan:   Problem List Items Addressed This Visit       Active Problems   Mixed hyperlipidemia - Primary    Checking labs today.  Continue current therapy for lipid control. Will modify as needed based on labwork results.       Relevant Orders   Lipid panel (Completed)   CBC With Differential (Completed)   CMP14+EGFR (Completed)   Gastroesophageal reflux disease without esophagitis    Patient stable.  Well controlled with current therapy.    Continue current meds.        Vitamin D deficiency, unspecified    Checking labs today.  Will continue supplements as needed.       Relevant Orders   VITAMIN D 25 Hydroxy (Vit-D Deficiency, Fractures) (Completed)   CBC With Differential (Completed)   CMP14+EGFR (Completed)   Sleep-related movement disorder   Relevant Orders   Ambulatory referral to Sleep Studies     Resolved Problems   RESOLVED: Leg cramps   Relevant Orders   CBC With Differential (Completed)   CMP14+EGFR (Completed)   Magnesium (Completed)   Other Visit Diagnoses     B12 deficiency due to diet  Checking labs today.  Will continue supplements as needed   Relevant Orders   CBC With Differential (Completed)   CMP14+EGFR (Completed)   Vitamin B12 (Completed)   Prediabetes       Relevant Orders   CBC With Differential (Completed)   CMP14+EGFR (Completed)   Hemoglobin A1c (Completed)   Other fatigue       Relevant Orders   CBC With Differential (Completed)   CMP14+EGFR (Completed)   TSH (Completed)   Excessive daytime sleepiness       Relevant Orders   Ambulatory referral to Sleep Studies       Return in about 3 months (around 06/14/2023) for F/U.   Total time spent: 30 minutes  Miki Kins, FNP  03/14/2023   This document may have been prepared by [facility] Voice Recognition software and as such may include unintentional dictation errors.

## 2023-03-27 ENCOUNTER — Telehealth: Payer: Self-pay

## 2023-03-27 NOTE — Telephone Encounter (Signed)
Patient need referral for ADHD meds

## 2023-03-28 ENCOUNTER — Telehealth: Payer: Self-pay

## 2023-04-03 ENCOUNTER — Other Ambulatory Visit: Payer: Self-pay | Admitting: Family

## 2023-04-04 ENCOUNTER — Telehealth: Payer: Self-pay | Admitting: Family

## 2023-04-04 NOTE — Telephone Encounter (Signed)
Patient's wife, Archie Patten, called in and states that patient is c/o his leg pain and cramping even more. Leg is now locking up when driving. He made an appt for this Thursday.

## 2023-04-06 ENCOUNTER — Ambulatory Visit: Payer: BC Managed Care – PPO | Admitting: Family

## 2023-04-06 VITALS — BP 102/58 | HR 83 | Ht 69.0 in | Wt 135.0 lb

## 2023-04-06 DIAGNOSIS — R6889 Other general symptoms and signs: Secondary | ICD-10-CM | POA: Diagnosis not present

## 2023-04-06 DIAGNOSIS — R41840 Attention and concentration deficit: Secondary | ICD-10-CM | POA: Diagnosis not present

## 2023-04-06 DIAGNOSIS — R252 Cramp and spasm: Secondary | ICD-10-CM | POA: Diagnosis not present

## 2023-04-06 MED ORDER — OFLOXACIN 0.3 % OT SOLN: 5.0000 [drp] | Freq: Every day | OTIC | 0 refills | Status: DC

## 2023-04-06 MED ORDER — METHOCARBAMOL 500 MG PO TABS: 500.0000 mg | ORAL_TABLET | Freq: Four times a day (QID) | ORAL | 0 refills | Status: DC | PRN

## 2023-04-13 ENCOUNTER — Other Ambulatory Visit: Payer: BC Managed Care – PPO

## 2023-04-17 ENCOUNTER — Ambulatory Visit: Payer: BC Managed Care – PPO

## 2023-04-17 DIAGNOSIS — R252 Cramp and spasm: Secondary | ICD-10-CM | POA: Diagnosis not present

## 2023-04-18 ENCOUNTER — Telehealth: Payer: Self-pay

## 2023-04-18 NOTE — Telephone Encounter (Signed)
A user error has taken place: encounter opened in error, closed for administrative reasons.

## 2023-04-18 NOTE — Telephone Encounter (Signed)
Patient was calling about getting appt scheduled

## 2023-04-25 ENCOUNTER — Telehealth: Payer: Self-pay | Admitting: Family

## 2023-04-25 NOTE — Telephone Encounter (Signed)
See task about his wife Sarajane Jews

## 2023-04-25 NOTE — Telephone Encounter (Signed)
Patient's wife left VM wanting patient's lab results. Please advise.

## 2023-04-25 NOTE — Telephone Encounter (Signed)
patients labs look good except his cholesterol  is uncontrolled

## 2023-04-26 NOTE — Telephone Encounter (Signed)
Patient's wife was wanting results of his ABI actually. I saw the interpretation on his chart and gave her the results.

## 2023-04-30 ENCOUNTER — Encounter: Payer: Self-pay | Admitting: Family

## 2023-04-30 NOTE — Progress Notes (Signed)
Acute Office Visit  Subjective:     Patient ID: Lucas Cook, male    DOB: 27-Jan-1976, 47 y.o.   MRN: [id number]  Patient is in today for  Chief Complaint  Patient presents with   Acute Visit    R Leg Pain    Patinet is here today with additional issues with his leg.  He has continued to have pain in his right leg, cramping, both while asleep and awake.  He cannot pinpoint anything that sees to specifically be causing the pain or cramping.   Also still needs referral to be evaluated for ADHD.   No other concerns today.      Review of Systems  Musculoskeletal:  Positive for myalgias.  All other systems reviewed and are negative.       Objective:    BP (!) 102/58   Pulse 83   Ht 5\' 9"  (1.753 m)   Wt 135 lb (61.2 kg)   SpO2 94%   BMI 19.94 kg/m   Physical Exam Vitals and nursing note reviewed.  Constitutional:      Appearance: Normal appearance. He is normal weight.  Eyes:     Extraocular Movements: Extraocular movements intact.     Conjunctiva/sclera: Conjunctivae normal.     Pupils: Pupils are equal, round, and reactive to light.  Cardiovascular:     Rate and Rhythm: Normal rate and regular rhythm.     Pulses: Normal pulses.     Heart sounds: Normal heart sounds.  Pulmonary:     Effort: Pulmonary effort is normal.     Breath sounds: Normal breath sounds.  Musculoskeletal:        General: Tenderness present.     Right lower leg: Tenderness present.  Neurological:     General: No focal deficit present.     Mental Status: He is alert and oriented to person, place, and time. Mental status is at baseline.  Psychiatric:        Attention and Perception: He is inattentive.        Mood and Affect: Mood and affect normal.        Speech: Speech normal.        Behavior: Behavior normal. Behavior is cooperative.        Thought Content: Thought content normal.        Cognition and Memory: Cognition and memory normal.        Judgment: Judgment normal.      No results found for any visits on 04/06/23.  Recent Results (from the past 2160 hour(s))  Lipid panel     Status: Abnormal   Collection Time: 03/14/23  3:53 PM  Result Value Ref Range   Cholesterol, Total 259 (H) 100 - 199 mg/dL   Triglycerides 409 0 - 149 mg/dL   HDL 33 (L) >81 mg/dL   VLDL Cholesterol Cal 23 5 - 40 mg/dL   LDL Chol Calc (NIH) 191 (H) 0 - 99 mg/dL   LDL CALC COMMENT: Comment     Comment: Consider evaluating for Familial Hypercholesterolemia(FH), if clinically indicated.    Chol/HDL Ratio 7.8 (H) 0.0 - 5.0 ratio    Comment:                                   T. Chol/HDL Ratio  Men  Women                               1/2 Avg.Risk  3.4    3.3                                   Avg.Risk  5.0    4.4                                2X Avg.Risk  9.6    7.1                                3X Avg.Risk 23.4   11.0   VITAMIN D 25 Hydroxy (Vit-D Deficiency, Fractures)     Status: None   Collection Time: 03/14/23  3:53 PM  Result Value Ref Range   Vit D, 25-Hydroxy 78.8 30.0 - 100.0 ng/mL    Comment: Vitamin D deficiency has been defined by the Institute of Medicine and an Endocrine Society practice guideline as a level of serum 25-OH vitamin D less than 20 ng/mL (1,2). The Endocrine Society went on to further define vitamin D insufficiency as a level between 21 and 29 ng/mL (2). 1. IOM (Institute of Medicine). 2010. Dietary reference    intakes for calcium and D. [place]: The    [employer]. 2. Holick MF, Binkley [place], Bischoff-Ferrari HA, et al.    Evaluation, treatment, and prevention of vitamin D    deficiency: an Endocrine Society clinical practice    guideline. JCEM. 2011 Jul; 96(7):1911-30.   CBC With Differential     Status: None   Collection Time: 03/14/23  3:53 PM  Result Value Ref Range   WBC 7.9 3.4 - 10.8 x10E3/uL   RBC 4.93 4.14 - 5.80 x10E6/uL   Hemoglobin 15.8 13.0 - 17.7 g/dL    Hematocrit 29.5 62.1 - 51.0 %   MCV 94 79 - 97 fL   MCH 32.0 26.6 - 33.0 pg   MCHC 34.2 31.5 - 35.7 g/dL   RDW 30.8 65.7 - 84.6 %   Neutrophils 59 Not Estab. %   Lymphs 29 Not Estab. %   Monocytes 8 Not Estab. %   Eos 2 Not Estab. %   Basos 1 Not Estab. %   Neutrophils Absolute 4.7 1.4 - 7.0 x10E3/uL   Lymphocytes Absolute 2.3 0.7 - 3.1 x10E3/uL   Monocytes Absolute 0.7 0.1 - 0.9 x10E3/uL   EOS (ABSOLUTE) 0.1 0.0 - 0.4 x10E3/uL   Basophils Absolute 0.1 0.0 - 0.2 x10E3/uL   Immature Granulocytes 1 Not Estab. %   Immature Grans (Abs) 0.0 0.0 - 0.1 x10E3/uL    Comment: **Effective March 27, 2023, profile [id number] CBC/Differential**   (No Platelet) will be made non-orderable. [facility] Offers:   [id number] CBC With Differential/Platelet   CMP14+EGFR     Status: Abnormal   Collection Time: 03/14/23  3:53 PM  Result Value Ref Range   Glucose 76 70 - 99 mg/dL   BUN 13 6 - 24 mg/dL   Creatinine, Ser 8.41 0.76 - 1.27 mg/dL   eGFR 97 >32 GM/WNU/2.72   BUN/Creatinine Ratio 13 9 - 20   Sodium 138 134 - 144 mmol/L   Potassium 4.0 3.5 -  5.2 mmol/L   Chloride 105 96 - 106 mmol/L   CO2 18 (L) 20 - 29 mmol/L   Calcium 9.5 8.7 - 10.2 mg/dL   Total Protein 6.9 6.0 - 8.5 g/dL   Albumin 4.7 4.1 - 5.1 g/dL   Globulin, Total 2.2 1.5 - 4.5 g/dL   Bilirubin Total <1.6 0.0 - 1.2 mg/dL   Alkaline Phosphatase 78 44 - 121 IU/L   AST 20 0 - 40 IU/L   ALT 10 0 - 44 IU/L  TSH     Status: None   Collection Time: 03/14/23  3:53 PM  Result Value Ref Range   TSH 1.340 0.450 - 4.500 uIU/mL  Hemoglobin A1c     Status: None   Collection Time: 03/14/23  3:53 PM  Result Value Ref Range   Hgb A1c MFr Bld 5.1 4.8 - 5.6 %    Comment:          Prediabetes: 5.7 - 6.4          Diabetes: >6.4          Glycemic control for adults with diabetes: <7.0    Est. average glucose Bld gHb Est-mCnc 100 mg/dL  Vitamin X09     Status: None   Collection Time: 03/14/23  3:53 PM  Result Value Ref Range   Vitamin B-12 582 232 -  1,245 pg/mL  Magnesium     Status: None   Collection Time: 03/14/23  3:53 PM  Result Value Ref Range   Magnesium 2.2 1.6 - 2.3 mg/dL       Assessment & Plan:   Problem List Items Addressed This Visit   None Visit Diagnoses     Leg cramps    -  Primary   Relevant Orders   US ARTERIAL ABI (SCREENING LOWER EXTREMITY) (Completed)   CT ANGIO AO+BIFEM W & OR WO CONTRAST   Abnormal ankle brachial index (ABI)       Relevant Orders   CT ANGIO AO+BIFEM W & OR WO CONTRAST   Attention and concentration deficit          Getting ABI for pt.  Sending referral to have ADHD testing done as well.   Return TBD Based on results, for F/U.  Total time spent: 20 minutes  Miki Kins, FNP  04/06/2023   This document may have been prepared by [facility] Voice Recognition software and as such may include unintentional dictation errors.

## 2023-05-17 ENCOUNTER — Other Ambulatory Visit: Payer: Self-pay | Admitting: Family

## 2023-05-22 ENCOUNTER — Ambulatory Visit: Payer: BC Managed Care – PPO | Admitting: Family

## 2023-05-22 VITALS — BP 120/80 | HR 64 | Ht 69.0 in | Wt 136.4 lb

## 2023-05-22 DIAGNOSIS — R252 Cramp and spasm: Secondary | ICD-10-CM | POA: Diagnosis not present

## 2023-05-22 DIAGNOSIS — Z1339 Encounter for screening examination for other mental health and behavioral disorders: Secondary | ICD-10-CM | POA: Diagnosis not present

## 2023-05-22 DIAGNOSIS — R5383 Other fatigue: Secondary | ICD-10-CM

## 2023-05-22 DIAGNOSIS — E782 Mixed hyperlipidemia: Secondary | ICD-10-CM | POA: Diagnosis not present

## 2023-05-22 DIAGNOSIS — Z0130 Encounter for examination of blood pressure without abnormal findings: Secondary | ICD-10-CM

## 2023-05-22 DIAGNOSIS — E559 Vitamin D deficiency, unspecified: Secondary | ICD-10-CM

## 2023-05-23 ENCOUNTER — Ambulatory Visit: Payer: BC Managed Care – PPO

## 2023-05-23 DIAGNOSIS — R252 Cramp and spasm: Secondary | ICD-10-CM

## 2023-05-23 DIAGNOSIS — R6889 Other general symptoms and signs: Secondary | ICD-10-CM | POA: Diagnosis not present

## 2023-05-23 LAB — IRON,TIBC AND FERRITIN PANEL
Ferritin: 70 ng/mL (ref 30–400)
Iron Saturation: 24 % (ref 15–55)
Iron: 83 ug/dL (ref 38–169)
Total Iron Binding Capacity: 350 ug/dL (ref 250–450)
UIBC: 267 ug/dL (ref 111–343)

## 2023-05-23 LAB — MAGNESIUM: Magnesium: 2 mg/dL (ref 1.6–2.3)

## 2023-05-23 LAB — PHOSPHORUS: Phosphorus: 3.6 mg/dL (ref 2.8–4.1)

## 2023-05-23 MED ORDER — TRAZODONE HCL 50 MG PO TABS: 50.0000 mg | ORAL_TABLET | Freq: Every evening | ORAL | 0 refills | Status: DC | PRN

## 2023-05-23 MED ORDER — IOHEXOL 350 MG/ML SOLN
125.0000 mL | Freq: Once | INTRAVENOUS | Status: AC | PRN
  Administered 2023-05-23: 125 mL via INTRAVENOUS

## 2023-05-23 MED ORDER — VARENICLINE TARTRATE (STARTER) 0.5 MG X 11 & 1 MG X 42 PO TBPK: ORAL_TABLET | ORAL | 0 refills | Status: AC

## 2023-06-06 ENCOUNTER — Telehealth: Payer: Self-pay | Admitting: Family

## 2023-06-06 NOTE — Telephone Encounter (Signed)
Tonya left VM that patient fell at work the other day and they went to [employer] but they couldn't figure out what was wrong with him. Patient reports pain and swelling of the elbow worsening. Would like something sent in for pain or swelling. Please advise.

## 2023-06-11 ENCOUNTER — Encounter: Payer: Self-pay | Admitting: Family

## 2023-06-16 ENCOUNTER — Other Ambulatory Visit: Payer: Self-pay | Admitting: Family

## 2023-06-16 ENCOUNTER — Ambulatory Visit: Payer: Self-pay | Admitting: Family

## 2023-06-19 ENCOUNTER — Ambulatory Visit: Payer: BC Managed Care – PPO | Admitting: Family

## 2023-06-22 ENCOUNTER — Encounter: Payer: Self-pay | Admitting: Family

## 2023-06-22 MED ORDER — VARENICLINE TARTRATE(CONTINUE) 1 MG PO TABS: 1.0000 mg | ORAL_TABLET | Freq: Two times a day (BID) | ORAL | 2 refills | Status: DC

## 2023-06-24 ENCOUNTER — Encounter: Payer: Self-pay | Admitting: Family

## 2023-06-24 DIAGNOSIS — Z1339 Encounter for screening examination for other mental health and behavioral disorders: Secondary | ICD-10-CM | POA: Insufficient documentation

## 2023-06-24 NOTE — Progress Notes (Signed)
Established Patient Office Visit  Subjective:  Patient ID: Lucas Cook, male    DOB: [birth year]  Age: 47 y.o. MRN: [id number]  No chief complaint on file.   Patient is here today for his 3 months follow up.  He has been feeling poorly since last appointment.   He does have additional concerns to discuss today.  He needs the referral to have ADHD testing done to be sent again.  They have not contacted him.  He also says that the has continued to have cramping and pain in his legs.  All of the labs we have done in the past to check for this have been normal. There has not been any improvement in symptoms since last visit.   Labs are due today. He needs refills.   I have reviewed his active problem list, medication list, allergies, notes from last encounter, lab results for his appointment today.      No other concerns at this time.   No past medical history on file.  No past surgical history on file.  Social History   Socioeconomic History   Marital status: Married    Spouse name: Not on file   Number of children: Not on file   Years of education: Not on file   Highest education level: Not on file  Occupational History   Not on file  Tobacco Use   Smoking status: Every Day    Types: Cigarettes   Smokeless tobacco: Never  Substance and Sexual Activity   Alcohol use: Not on file   Drug use: Not on file   Sexual activity: Not on file  Other Topics Concern   Not on file  Social History Narrative   Not on file   Social Determinants of Health   Financial Resource Strain: Not on file  Food Insecurity: Not on file  Transportation Needs: Not on file  Physical Activity: Not on file  Stress: Not on file  Social Connections: Not on file  Intimate Partner Violence: Not on file    No family history on file.  No Known Allergies  Review of Systems  Cardiovascular:  Positive for claudication.  Musculoskeletal:  Positive for joint pain and myalgias.  All  other systems reviewed and are negative.      Objective:   BP 120/80   Pulse 64   Ht 5\' 9"  (1.753 m)   Wt 136 lb 6.4 oz (61.9 kg)   SpO2 97%   BMI 20.14 kg/m   Vitals:   05/22/23 1447  BP: 120/80  Pulse: 64  Height: 5\' 9"  (1.753 m)  Weight: 136 lb 6.4 oz (61.9 kg)  SpO2: 97%  BMI (Calculated): 20.13    Physical Exam Vitals and nursing note reviewed.  Constitutional:      Appearance: Normal appearance. He is normal weight.  Eyes:     Pupils: Pupils are equal, round, and reactive to light.  Cardiovascular:     Rate and Rhythm: Normal rate and regular rhythm.     Pulses: Normal pulses.     Heart sounds: Normal heart sounds.  Pulmonary:     Effort: Pulmonary effort is normal.     Breath sounds: Normal breath sounds.  Musculoskeletal:        General: Normal range of motion.  Neurological:     General: No focal deficit present.     Mental Status: He is alert and oriented to person, place, and time. Mental status is at baseline.  Psychiatric:  Attention and Perception: Perception normal. He is inattentive.        Mood and Affect: Mood and affect normal.        Speech: Speech normal.        Behavior: Behavior normal. Behavior is cooperative.        Thought Content: Thought content normal.        Cognition and Memory: Cognition and memory normal.        Judgment: Judgment normal.      Results for orders placed or performed in visit on 05/22/23  Iron, TIBC and Ferritin Panel  Result Value Ref Range   Total Iron Binding Capacity 350 250 - 450 ug/dL   UIBC 161 096 - 045 ug/dL   Iron 83 38 - 409 ug/dL   Iron Saturation 24 15 - 55 %   Ferritin 70 30 - 400 ng/mL  Magnesium  Result Value Ref Range   Magnesium 2.0 1.6 - 2.3 mg/dL  Phosphorus  Result Value Ref Range   Phosphorus 3.6 2.8 - 4.1 mg/dL    Recent Results (from the past 2160 hour(s))  Iron, TIBC and Ferritin Panel     Status: None   Collection Time: 05/22/23  3:49 PM  Result Value Ref Range    Total Iron Binding Capacity 350 250 - 450 ug/dL   UIBC 811 914 - 782 ug/dL   Iron 83 38 - 956 ug/dL   Iron Saturation 24 15 - 55 %   Ferritin 70 30 - 400 ng/mL  Magnesium     Status: None   Collection Time: 05/22/23  3:49 PM  Result Value Ref Range   Magnesium 2.0 1.6 - 2.3 mg/dL  Phosphorus     Status: None   Collection Time: 05/22/23  3:49 PM  Result Value Ref Range   Phosphorus 3.6 2.8 - 4.1 mg/dL       Assessment & Plan:   Problem List Items Addressed This Visit       Active Problems   Mixed hyperlipidemia - Primary    Checking labs today.  Continue current therapy for lipid control. Will modify as needed based on labwork results.        Relevant Orders   Iron, TIBC and Ferritin Panel (Completed)   Magnesium (Completed)   Phosphorus (Completed)   Vitamin D deficiency, unspecified    Checking labs today.  Will continue supplements as needed.        Leg cramps    Checking additional labs today.  Will determine next steps based on results.       Relevant Orders   Iron, TIBC and Ferritin Panel (Completed)   Magnesium (Completed)   Phosphorus (Completed)   Attention deficit hyperactivity disorder (ADHD) evaluation    Sending referral for pt for ADHD eval.   Will defer changes until after testing has been completed.       Relevant Orders   Ambulatory referral to Psychology   Other Visit Diagnoses     Other fatigue       Relevant Orders   Iron, TIBC and Ferritin Panel (Completed)   Magnesium (Completed)   Phosphorus (Completed)       Return in about 3 months (around 08/21/2023) for F/U.   Total time spent: 30 minutes  Miki Kins, FNP  05/22/2023   This document may have been prepared by [facility] Voice Recognition software and as such may include unintentional dictation errors.

## 2023-06-24 NOTE — Assessment & Plan Note (Signed)
Checking labs today.  Will continue supplements as needed.  

## 2023-06-24 NOTE — Assessment & Plan Note (Signed)
Checking labs today.  Continue current therapy for lipid control. Will modify as needed based on labwork results.  

## 2023-06-24 NOTE — Assessment & Plan Note (Signed)
Sending referral for pt for ADHD eval.   Will defer changes until after testing has been completed.

## 2023-06-24 NOTE — Assessment & Plan Note (Signed)
Checking additional labs today.  Will determine next steps based on results.

## 2023-06-30 MED ORDER — VARENICLINE TARTRATE(CONTINUE) 1 MG PO TABS: 1.0000 mg | ORAL_TABLET | Freq: Two times a day (BID) | ORAL | 2 refills | Status: DC

## 2023-06-30 NOTE — Addendum Note (Signed)
Addended by: Grayling Congress on: 06/30/2023 03:39 PM   Modules accepted: Orders

## 2023-08-18 ENCOUNTER — Ambulatory Visit (INDEPENDENT_AMBULATORY_CARE_PROVIDER_SITE_OTHER): Payer: 59 | Admitting: Family

## 2023-08-18 VITALS — BP 122/80 | HR 76 | Ht 69.0 in | Wt 140.6 lb

## 2023-08-18 DIAGNOSIS — K219 Gastro-esophageal reflux disease without esophagitis: Secondary | ICD-10-CM

## 2023-08-18 DIAGNOSIS — E782 Mixed hyperlipidemia: Secondary | ICD-10-CM | POA: Diagnosis not present

## 2023-08-18 DIAGNOSIS — G4719 Other hypersomnia: Secondary | ICD-10-CM | POA: Diagnosis not present

## 2023-08-18 DIAGNOSIS — Z0130 Encounter for examination of blood pressure without abnormal findings: Secondary | ICD-10-CM

## 2023-08-18 NOTE — Progress Notes (Signed)
 Established Patient Office Visit  Subjective:  Patient ID: Lucas Cook, male    DOB: 10-30-1975  Age: 47 y.o. MRN: [id number]  Chief Complaint  Patient presents with   Follow-up    Patient is here today for his 3 months follow up.  He has been feeling fairly well since last appointment.   He does have additional concerns to discuss today.  Getting ready to have his elbow surgery next week.   Labs are not due today. He needs refills.   I have reviewed his active problem list, medication list, allergies, notes from last encounter, lab results for his appointment today.      No other concerns at this time.   Past Medical History:  Diagnosis Date   Spontaneous rupture of other tendons, right upper arm 09/08/2023    History reviewed. No pertinent surgical history.  Social History   Socioeconomic History   Marital status: Married    Spouse name: Not on file   Number of children: Not on file   Years of education: Not on file   Highest education level: Not on file  Occupational History   Not on file  Tobacco Use   Smoking status: Every Day    Types: Cigarettes   Smokeless tobacco: Never   Tobacco comments:    Patient taking Varenicline  Substance and Sexual Activity   Alcohol use: Not on file   Drug use: Not on file   Sexual activity: Not on file  Other Topics Concern   Not on file  Social History Narrative   Not on file   Social Drivers of Health   Financial Resource Strain: Not on file  Food Insecurity: Not on file  Transportation Needs: Not on file  Physical Activity: Not on file  Stress: Not on file  Social Connections: Not on file  Intimate Partner Violence: Not on file    History reviewed. No pertinent family history.  No Known Allergies  Review of Systems  All other systems reviewed and are negative.      Objective:   BP 122/80   Pulse 76   Ht 5\' 9"  (1.753 m)   Wt 140 lb 9.6 oz (63.8 kg)   SpO2 99%   BMI 20.76 kg/m    Vitals:   08/18/23 1510  BP: 122/80  Pulse: 76  Height: 5\' 9"  (1.753 m)  Weight: 140 lb 9.6 oz (63.8 kg)  SpO2: 99%  BMI (Calculated): 20.75    Physical Exam Vitals and nursing note reviewed.  Constitutional:      Appearance: Normal appearance. He is normal weight.  Eyes:     Pupils: Pupils are equal, round, and reactive to light.  Cardiovascular:     Rate and Rhythm: Normal rate and regular rhythm.     Pulses: Normal pulses.     Heart sounds: Normal heart sounds.  Pulmonary:     Effort: Pulmonary effort is normal.     Breath sounds: Normal breath sounds.  Musculoskeletal:        General: Deformity present. Normal range of motion.  Neurological:     Mental Status: He is alert.  Psychiatric:        Mood and Affect: Mood normal.        Behavior: Behavior normal.        Thought Content: Thought content normal.        Judgment: Judgment normal.      No results found for any visits on 08/18/23.  No  results found for this or any previous visit (from the past 2160 hours).     Assessment & Plan:   Problem List Items Addressed This Visit       Digestive   Gastroesophageal reflux disease without esophagitis   Patient stable.  Well controlled with current therapy.   Continue current meds.          Other   Mixed hyperlipidemia - Primary   Continue current therapy for lipid control. Will modify as needed based on labwork results.        Other Visit Diagnoses       Excessive daytime sleepiness       Patient is scheduled for his sleep study/follow up.  Will defer to them for his follow up treatments.       Return in about 3 months (around 11/16/2023).   Total time spent: 20 minutes  Miki Kins, FNP  08/18/2023   This document may have been prepared by [facility] Voice Recognition software and as such may include unintentional dictation errors.

## 2023-08-28 ENCOUNTER — Other Ambulatory Visit: Payer: Self-pay | Admitting: Family

## 2023-09-08 DIAGNOSIS — S46319A Strain of muscle, fascia and tendon of triceps, unspecified arm, initial encounter: Secondary | ICD-10-CM | POA: Insufficient documentation

## 2023-09-08 DIAGNOSIS — M66821 Spontaneous rupture of other tendons, right upper arm: Secondary | ICD-10-CM | POA: Insufficient documentation

## 2023-09-08 HISTORY — DX: Spontaneous rupture of other tendons, right upper arm: M66.821

## 2023-11-16 ENCOUNTER — Ambulatory Visit: Payer: 59 | Admitting: Family

## 2023-11-16 ENCOUNTER — Encounter: Payer: Self-pay | Admitting: Family

## 2023-11-16 VITALS — BP 100/60 | HR 73 | Ht 69.0 in | Wt 150.4 lb

## 2023-11-16 DIAGNOSIS — E559 Vitamin D deficiency, unspecified: Secondary | ICD-10-CM | POA: Diagnosis not present

## 2023-11-16 DIAGNOSIS — G4769 Other sleep related movement disorders: Secondary | ICD-10-CM | POA: Diagnosis not present

## 2023-11-16 DIAGNOSIS — R252 Cramp and spasm: Secondary | ICD-10-CM

## 2023-11-16 DIAGNOSIS — E782 Mixed hyperlipidemia: Secondary | ICD-10-CM | POA: Diagnosis not present

## 2023-11-16 DIAGNOSIS — E538 Deficiency of other specified B group vitamins: Secondary | ICD-10-CM

## 2023-11-16 DIAGNOSIS — R5383 Other fatigue: Secondary | ICD-10-CM

## 2023-11-16 DIAGNOSIS — Z0130 Encounter for examination of blood pressure without abnormal findings: Secondary | ICD-10-CM

## 2023-11-16 NOTE — Assessment & Plan Note (Signed)
 Patient stable.  Well controlled with current therapy.   Continue current meds.

## 2023-11-16 NOTE — Assessment & Plan Note (Signed)
 Checking labs today.  Will continue supplements as needed.

## 2023-11-16 NOTE — Progress Notes (Signed)
 Established Patient Office Visit  Subjective:  Patient ID: Lucas Cook, male    DOB: 11/27/1975  Age: 48 y.o. MRN: [id number]  Chief Complaint  Patient presents with   Follow-up    3 month follow up    Patient is here today for his 3 months follow up.  He has been feeling well since last appointment.   He does have additional concerns to discuss today.  His legs have started doing their spasms again. His arm is better at this point.   Labs are due today. He needs refills.   I have reviewed his active problem list, medication list, allergies, notes from last encounter, lab results for his appointment today.      No other concerns at this time.   No past medical history on file.  No past surgical history on file.  Social History   Socioeconomic History   Marital status: Married    Spouse name: Not on file   Number of children: Not on file   Years of education: Not on file   Highest education level: Not on file  Occupational History   Not on file  Tobacco Use   Smoking status: Every Day    Types: Cigarettes   Smokeless tobacco: Never   Tobacco comments:    Patient taking Varenicline  Substance and Sexual Activity   Alcohol use: Not on file   Drug use: Not on file   Sexual activity: Not on file  Other Topics Concern   Not on file  Social History Narrative   Not on file   Social Drivers of Health   Financial Resource Strain: Not on file  Food Insecurity: Not on file  Transportation Needs: Not on file  Physical Activity: Not on file  Stress: Not on file  Social Connections: Not on file  Intimate Partner Violence: Not on file    No family history on file.  No Known Allergies  Review of Systems  All other systems reviewed and are negative.      Objective:   BP 100/60   Pulse 73   Ht 5\' 9"  (1.753 m)   Wt 150 lb 6.4 oz (68.2 kg)   SpO2 97%   BMI 22.21 kg/m   Vitals:   11/16/23 1511  BP: 100/60  Pulse: 73  Height: 5\' 9"  (1.753  m)  Weight: 150 lb 6.4 oz (68.2 kg)  SpO2: 97%  BMI (Calculated): 22.2    Physical Exam Vitals and nursing note reviewed.  Constitutional:      Appearance: Normal appearance. He is normal weight.  Eyes:     Pupils: Pupils are equal, round, and reactive to light.  Cardiovascular:     Rate and Rhythm: Normal rate and regular rhythm.     Pulses: Normal pulses.     Heart sounds: Normal heart sounds.  Pulmonary:     Effort: Pulmonary effort is normal.     Breath sounds: Normal breath sounds.  Neurological:     General: No focal deficit present.     Mental Status: He is alert and oriented to person, place, and time. Mental status is at baseline.  Psychiatric:        Mood and Affect: Mood normal.        Behavior: Behavior normal.      No results found for any visits on 11/16/23.  No results found for this or any previous visit (from the past 2160 hours).     Assessment & Plan:  Problem List Items Addressed This Visit       Other   Mixed hyperlipidemia   Checking labs today.  Continue current therapy for lipid control. Will modify as needed based on labwork results.       Relevant Orders   Lipid panel   CMP14+EGFR   Hemoglobin A1c   CBC with Differential/Platelet   Vitamin D deficiency, unspecified - Primary   Checking labs today.  Will continue supplements as needed.       Relevant Orders   VITAMIN D 25 Hydroxy (Vit-D Deficiency, Fractures)   CMP14+EGFR   Hemoglobin A1c   CBC with Differential/Platelet   Sleep-related movement disorder   Patient stable.  Well controlled with current therapy.   Continue current meds.        Relevant Orders   CMP14+EGFR   Hemoglobin A1c   CBC with Differential/Platelet   Leg cramps   Checking labs today.  Will continue supplements as needed.        Relevant Orders   CMP14+EGFR   Hemoglobin A1c   CBC with Differential/Platelet   Other Visit Diagnoses       B12 deficiency due to diet       Relevant Orders    Hemoglobin A1c   Vitamin B12   CBC with Differential/Platelet     Other fatigue       Relevant Orders   Hemoglobin A1c   CBC with Differential/Platelet       Return in about 3 months (around 02/16/2024).   Total time spent: 20 minutes  Miki Kins, FNP  11/16/2023  This document may have been prepared by [facility] Voice Recognition software and as such may include unintentional dictation errors.

## 2023-11-16 NOTE — Assessment & Plan Note (Signed)
 Checking labs today.  Continue current therapy for lipid control. Will modify as needed based on labwork results.

## 2023-11-19 ENCOUNTER — Encounter: Payer: Self-pay | Admitting: Family

## 2023-11-19 NOTE — Assessment & Plan Note (Signed)
 Patient stable.  Well controlled with current therapy.   Continue current meds.

## 2023-11-19 NOTE — Assessment & Plan Note (Signed)
 Continue current therapy for lipid control. Will modify as needed based on labwork results.

## 2023-11-20 ENCOUNTER — Other Ambulatory Visit

## 2023-11-21 LAB — CMP14+EGFR
ALT: 10 IU/L (ref 0–44)
AST: 19 IU/L (ref 0–40)
Albumin: 4.3 g/dL (ref 4.1–5.1)
Alkaline Phosphatase: 91 IU/L (ref 44–121)
BUN/Creatinine Ratio: 16 (ref 9–20)
BUN: 15 mg/dL (ref 6–24)
Bilirubin Total: <0.2 mg/dL (ref 0.0–1.2)
CO2: 20 mmol/L (ref 20–29)
Calcium: 8.9 mg/dL (ref 8.7–10.2)
Chloride: 110 mmol/L — ABNORMAL HIGH (ref 96–106)
Creatinine, Ser: 0.94 mg/dL (ref 0.76–1.27)
Globulin, Total: 2 g/dL (ref 1.5–4.5)
Glucose: 66 mg/dL — ABNORMAL LOW (ref 70–99)
Potassium: 4.3 mmol/L (ref 3.5–5.2)
Sodium: 143 mmol/L (ref 134–144)
Total Protein: 6.3 g/dL (ref 6.0–8.5)
eGFR: 101 mL/min/{1.73_m2} (ref >59)

## 2023-11-21 LAB — HEMOGLOBIN A1C
Est. average glucose Bld gHb Est-mCnc: 105 mg/dL
Hgb A1c MFr Bld: 5.3 % (ref 4.8–5.6)

## 2023-11-21 LAB — VITAMIN B12: Vitamin B-12: 427 pg/mL (ref 232–1245)

## 2023-11-21 LAB — LIPID PANEL
Chol/HDL Ratio: 7.8 ratio — ABNORMAL HIGH (ref 0.0–5.0)
Cholesterol, Total: 219 mg/dL — ABNORMAL HIGH (ref 100–199)
HDL: 28 mg/dL — ABNORMAL LOW (ref >39)
LDL Chol Calc (NIH): 164 mg/dL — ABNORMAL HIGH (ref 0–99)
Triglycerides: 148 mg/dL (ref 0–149)
VLDL Cholesterol Cal: 27 mg/dL (ref 5–40)

## 2023-11-21 LAB — VITAMIN D 25 HYDROXY (VIT D DEFICIENCY, FRACTURES): Vit D, 25-Hydroxy: 41.3 ng/mL (ref 30.0–100.0)

## 2023-11-27 ENCOUNTER — Telehealth: Payer: Self-pay | Admitting: Family

## 2023-11-27 NOTE — Telephone Encounter (Signed)
 Patient's wife called stating he still needs ADHD testing or to be put on meds because having more difficulty at work with focus and concentration. Please advise.

## 2023-11-29 MED ORDER — NEXLIZET 180-10 MG PO TABS
1.0000 | ORAL_TABLET | Freq: Every day | ORAL | 3 refills | Status: DC
Start: 2023-11-29 — End: 2024-05-28

## 2023-11-29 NOTE — Addendum Note (Signed)
 Addended by: Grayling Congress on: 11/29/2023 09:38 AM   Modules accepted: Orders

## 2023-12-05 ENCOUNTER — Other Ambulatory Visit: Payer: Self-pay

## 2023-12-05 DIAGNOSIS — R41840 Attention and concentration deficit: Secondary | ICD-10-CM

## 2023-12-05 NOTE — Telephone Encounter (Signed)
 Referral is pended  to provider

## 2023-12-20 ENCOUNTER — Other Ambulatory Visit: Payer: Self-pay | Admitting: Family

## 2024-02-19 ENCOUNTER — Ambulatory Visit
Admission: RE | Admit: 2024-02-19 | Discharge: 2024-02-19 | Disposition: A | Discharge status: Home / Self Care | Source: Ambulatory Visit | Attending: Family | Admitting: Family

## 2024-02-19 ENCOUNTER — Encounter: Payer: Self-pay | Admitting: Family

## 2024-02-19 ENCOUNTER — Ambulatory Visit: Admitting: Family

## 2024-02-19 ENCOUNTER — Ambulatory Visit
Admission: RE | Admit: 2024-02-19 | Discharge: 2024-02-19 | Disposition: A | Discharge status: Home / Self Care | Attending: Family | Admitting: Family

## 2024-02-19 VITALS — BP 100/66 | HR 66 | Ht 69.0 in | Wt 142.8 lb

## 2024-02-19 DIAGNOSIS — Z0130 Encounter for examination of blood pressure without abnormal findings: Secondary | ICD-10-CM

## 2024-02-19 DIAGNOSIS — M25521 Pain in right elbow: Secondary | ICD-10-CM | POA: Diagnosis present

## 2024-02-19 DIAGNOSIS — H60313 Diffuse otitis externa, bilateral: Secondary | ICD-10-CM | POA: Diagnosis not present

## 2024-02-19 MED ORDER — OFLOXACIN 0.3 % OT SOLN: 5.0000 [drp] | Freq: Every day | OTIC | 0 refills | Status: DC

## 2024-02-29 ENCOUNTER — Ambulatory Visit: Payer: Self-pay

## 2024-03-22 ENCOUNTER — Ambulatory Visit: Admitting: Family

## 2024-03-22 ENCOUNTER — Encounter: Payer: Self-pay | Admitting: Family

## 2024-03-22 VITALS — BP 106/60 | HR 77 | Ht 69.0 in | Wt 142.2 lb

## 2024-03-22 DIAGNOSIS — E538 Deficiency of other specified B group vitamins: Secondary | ICD-10-CM

## 2024-03-22 DIAGNOSIS — E782 Mixed hyperlipidemia: Secondary | ICD-10-CM | POA: Diagnosis not present

## 2024-03-22 DIAGNOSIS — H9203 Otalgia, bilateral: Secondary | ICD-10-CM

## 2024-03-22 DIAGNOSIS — E559 Vitamin D deficiency, unspecified: Secondary | ICD-10-CM

## 2024-03-22 DIAGNOSIS — G4769 Other sleep related movement disorders: Secondary | ICD-10-CM

## 2024-03-22 DIAGNOSIS — R252 Cramp and spasm: Secondary | ICD-10-CM

## 2024-03-22 DIAGNOSIS — R5383 Other fatigue: Secondary | ICD-10-CM

## 2024-03-22 MED ORDER — ESZOPICLONE 1 MG PO TABS: 1.0000 mg | ORAL_TABLET | Freq: Every evening | ORAL | 0 refills | Status: DC | PRN

## 2024-03-22 NOTE — Progress Notes (Signed)
 Established Patient Office Visit  Subjective:  Patient ID: Lucas Cook, male    DOB: 11-26-1975  Age: 48 y.o. MRN: [id number]  Chief Complaint  Patient presents with   Follow-up    1 month follow up    ENT referral  -still having ear pain  Trazodone  not helping him sleep, but makes him sleepy the next morning.  Asks if we can try something else      No other concerns at this time.   Past Medical History:  Diagnosis Date   Rupture of triceps tendon 09/08/2023   Spontaneous rupture of other tendons, right upper arm 09/08/2023    History reviewed. No pertinent surgical history.  Social History   Socioeconomic History   Marital status: Married    Spouse name: Not on file   Number of children: Not on file   Years of education: Not on file   Highest education level: Not on file  Occupational History   Not on file  Tobacco Use   Smoking status: Every Day    Types: Cigarettes   Smokeless tobacco: Never   Tobacco comments:    Patient taking Varenicline   Substance and Sexual Activity   Alcohol use: Not on file   Drug use: Not on file   Sexual activity: Not on file  Other Topics Concern   Not on file  Social History Narrative   Not on file   Social Drivers of Health   Financial Resource Strain: Not on file  Food Insecurity: Not on file  Transportation Needs: Not on file  Physical Activity: Not on file  Stress: Not on file  Social Connections: Not on file  Intimate Partner Violence: Not on file    History reviewed. No pertinent family history.  No Known Allergies  Review of Systems  All other systems reviewed and are negative.      Objective:   BP 106/60   Pulse 77   Ht 5' 9 (1.753 m)   Wt 142 lb 3.2 oz (64.5 kg)   SpO2 97%   BMI 21.00 kg/m   Vitals:   03/22/24 1515  BP: 106/60  Pulse: 77  Height: 5' 9 (1.753 m)  Weight: 142 lb 3.2 oz (64.5 kg)  SpO2: 97%  BMI (Calculated): 20.99    Physical Exam Vitals and nursing note  reviewed.  Constitutional:      Appearance: Normal appearance. He is normal weight.  Eyes:     Pupils: Pupils are equal, round, and reactive to light.  Cardiovascular:     Rate and Rhythm: Normal rate and regular rhythm.     Pulses: Normal pulses.     Heart sounds: Normal heart sounds.  Pulmonary:     Effort: Pulmonary effort is normal.     Breath sounds: Normal breath sounds.  Neurological:     Mental Status: He is alert.  Psychiatric:        Mood and Affect: Mood normal.        Behavior: Behavior normal.      No results found for any visits on 03/22/24.  No results found for this or any previous visit (from the past 2160 hours).     Assessment & Plan Vitamin D  deficiency, unspecified B12 deficiency due to diet Other fatigue Checking labs today.  Will continue supplements as needed.   - Vitamin D  - Vitamin B12 - TSH  Mixed hyperlipidemia Checking labs today.  Continue current therapy for lipid control. Will modify as needed based  on labwork results.   -CMP w/eGFR -Lipid Panel  Leg cramps Sleep-related movement disorder Checking labs today Will call with results when available.      Return in about 2 months (around 05/23/2024).   Total time spent: 20 minutes  ALAN CHRISTELLA ARRANT, FNP  03/22/2024   This document may have been prepared by [facility] Voice Recognition software and as such may include unintentional dictation errors.

## 2024-03-23 LAB — CMP14+EGFR
ALT: 12 IU/L (ref 0–44)
AST: 24 IU/L (ref 0–40)
Albumin: 4.6 g/dL (ref 4.1–5.1)
Alkaline Phosphatase: 80 IU/L (ref 44–121)
BUN/Creatinine Ratio: 11 (ref 9–20)
BUN: 12 mg/dL (ref 6–24)
Bilirubin Total: <0.2 mg/dL (ref 0.0–1.2)
CO2: 14 mmol/L — ABNORMAL LOW (ref 20–29)
Calcium: 9 mg/dL (ref 8.7–10.2)
Chloride: 108 mmol/L — ABNORMAL HIGH (ref 96–106)
Creatinine, Ser: 1.08 mg/dL (ref 0.76–1.27)
Globulin, Total: 2.4 g/dL (ref 1.5–4.5)
Glucose: 82 mg/dL (ref 70–99)
Potassium: 3.6 mmol/L (ref 3.5–5.2)
Sodium: 139 mmol/L (ref 134–144)
Total Protein: 7 g/dL (ref 6.0–8.5)
eGFR: 86 mL/min/1.73 (ref >59)

## 2024-03-23 LAB — CBC WITH DIFFERENTIAL/PLATELET
Basophils Absolute: 0.1 x10E3/uL (ref 0.0–0.2)
Basos: 1 %
EOS (ABSOLUTE): 0.1 x10E3/uL (ref 0.0–0.4)
Eos: 1 %
Hematocrit: 45.8 % (ref 37.5–51.0)
Hemoglobin: 15.3 g/dL (ref 13.0–17.7)
Immature Grans (Abs): 0 x10E3/uL (ref 0.0–0.1)
Immature Granulocytes: 0 %
Lymphocytes Absolute: 2.9 x10E3/uL (ref 0.7–3.1)
Lymphs: 30 %
MCH: 31.5 pg (ref 26.6–33.0)
MCHC: 33.4 g/dL (ref 31.5–35.7)
MCV: 94 fL (ref 79–97)
Monocytes Absolute: 0.8 x10E3/uL (ref 0.1–0.9)
Monocytes: 8 %
Neutrophils Absolute: 5.7 x10E3/uL (ref 1.4–7.0)
Neutrophils: 60 %
Platelets: 254 x10E3/uL (ref 150–450)
RBC: 4.86 x10E6/uL (ref 4.14–5.80)
RDW: 13.3 % (ref 11.6–15.4)
WBC: 9.6 x10E3/uL (ref 3.4–10.8)

## 2024-03-23 LAB — LIPID PANEL
Chol/HDL Ratio: 7 ratio — ABNORMAL HIGH (ref 0.0–5.0)
Cholesterol, Total: 223 mg/dL — ABNORMAL HIGH (ref 100–199)
HDL: 32 mg/dL — ABNORMAL LOW (ref >39)
LDL Chol Calc (NIH): 177 mg/dL — ABNORMAL HIGH (ref 0–99)
Triglycerides: 77 mg/dL (ref 0–149)
VLDL Cholesterol Cal: 14 mg/dL (ref 5–40)

## 2024-03-23 LAB — HEMOGLOBIN A1C
Est. average glucose Bld gHb Est-mCnc: 103 mg/dL
Hgb A1c MFr Bld: 5.2 % (ref 4.8–5.6)

## 2024-03-23 LAB — VITAMIN D 25 HYDROXY (VIT D DEFICIENCY, FRACTURES): Vit D, 25-Hydroxy: 26.8 ng/mL — ABNORMAL LOW (ref 30.0–100.0)

## 2024-03-23 LAB — VITAMIN B12: Vitamin B-12: 562 pg/mL (ref 232–1245)

## 2024-04-01 ENCOUNTER — Other Ambulatory Visit: Payer: Self-pay | Admitting: Family

## 2024-04-10 NOTE — Progress Notes (Signed)
 Established Patient Office Visit  Subjective:  Patient ID: Lucas Cook, male    DOB: 02/01/1976  Age: 48 y.o. MRN: [id number]  Chief Complaint  Patient presents with   Follow-up    3 month follow up    Patient is here today for his 3 months follow up.  He has been feeling fairly well since last appointment.   He does have additional concerns to discuss today.  1) Has been having pain in his ears. This has been occurring for less than 1 week.  2) Still having pain in his right elbow.   Labs are not due today.  He needs refills.   I have reviewed his active problem list, medication list, allergies, notes from last encounter, lab results for his appointment today.      No other concerns at this time.   Past Medical History:  Diagnosis Date   Rupture of triceps tendon 09/08/2023   Spontaneous rupture of other tendons, right upper arm 09/08/2023    History reviewed. No pertinent surgical history.  Social History   Socioeconomic History   Marital status: Married    Spouse name: Not on file   Number of children: Not on file   Years of education: Not on file   Highest education level: Not on file  Occupational History   Not on file  Tobacco Use   Smoking status: Every Day    Types: Cigarettes   Smokeless tobacco: Never   Tobacco comments:    Patient taking Varenicline   Substance and Sexual Activity   Alcohol use: Not on file   Drug use: Not on file   Sexual activity: Not on file  Other Topics Concern   Not on file  Social History Narrative   Not on file   Social Drivers of Health   Financial Resource Strain: Not on file  Food Insecurity: Not on file  Transportation Needs: Not on file  Physical Activity: Not on file  Stress: Not on file  Social Connections: Not on file  Intimate Partner Violence: Not on file    History reviewed. No pertinent family history.  No Known Allergies  Review of Systems  HENT:  Positive for ear pain.    Musculoskeletal:  Positive for joint pain.  All other systems reviewed and are negative.      Objective:   BP 100/66   Pulse 66   Ht 5' 9 (1.753 m)   Wt 142 lb 12.8 oz (64.8 kg)   SpO2 97%   BMI 21.09 kg/m   Vitals:   02/19/24 1521  BP: 100/66  Pulse: 66  Height: 5' 9 (1.753 m)  Weight: 142 lb 12.8 oz (64.8 kg)  SpO2: 97%  BMI (Calculated): 21.08    Physical Exam Vitals and nursing note reviewed.  Constitutional:      Appearance: Normal appearance. He is normal weight.  HENT:     Right Ear: Drainage, swelling and tenderness present.     Left Ear: Drainage, swelling and tenderness present.  Eyes:     Pupils: Pupils are equal, round, and reactive to light.  Cardiovascular:     Rate and Rhythm: Normal rate and regular rhythm.     Pulses: Normal pulses.     Heart sounds: Normal heart sounds.  Pulmonary:     Effort: Pulmonary effort is normal.     Breath sounds: Normal breath sounds.  Musculoskeletal:     Right elbow: Decreased range of motion. Tenderness present.  Neurological:  Mental Status: He is alert.  Psychiatric:        Mood and Affect: Mood normal.        Behavior: Behavior normal.        Thought Content: Thought content normal.        Judgment: Judgment normal.      No results found for any visits on 02/19/24.  Recent Results (from the past 2160 hours)  CMP14+EGFR     Status: Abnormal   Collection Time: 03/22/24  3:52 PM  Result Value Ref Range   Glucose 82 70 - 99 mg/dL   BUN 12 6 - 24 mg/dL   Creatinine, Ser 8.91 0.76 - 1.27 mg/dL   eGFR 86 >40 fO/fpw/8.26   BUN/Creatinine Ratio 11 9 - 20   Sodium 139 134 - 144 mmol/L   Potassium 3.6 3.5 - 5.2 mmol/L   Chloride 108 (H) 96 - 106 mmol/L   CO2 14 (L) 20 - 29 mmol/L    Comment: **Verified by repeat analysis**   Calcium 9.0 8.7 - 10.2 mg/dL   Total Protein 7.0 6.0 - 8.5 g/dL   Albumin 4.6 4.1 - 5.1 g/dL   Globulin, Total 2.4 1.5 - 4.5 g/dL   Bilirubin Total <9.7 0.0 - 1.2 mg/dL    Alkaline Phosphatase 80 44 - 121 IU/L   AST 24 0 - 40 IU/L   ALT 12 0 - 44 IU/L  Lipid panel     Status: Abnormal   Collection Time: 03/22/24  3:52 PM  Result Value Ref Range   Cholesterol, Total 223 (H) 100 - 199 mg/dL   Triglycerides 77 0 - 149 mg/dL   HDL 32 (L) >60 mg/dL   VLDL Cholesterol Cal 14 5 - 40 mg/dL   LDL Chol Calc (NIH) 822 (H) 0 - 99 mg/dL   Chol/HDL Ratio 7.0 (H) 0.0 - 5.0 ratio    Comment:                                   T. Chol/HDL Ratio                                             Men  Women                               1/2 Avg.Risk  3.4    3.3                                   Avg.Risk  5.0    4.4                                2X Avg.Risk  9.6    7.1                                3X Avg.Risk 23.4   11.0   VITAMIN D  25 Hydroxy (Vit-D Deficiency, Fractures)     Status: Abnormal   Collection Time: 03/22/24  3:52 PM  Result Value Ref Range   Vit D, 25-Hydroxy 26.8 (L)  30.0 - 100.0 ng/mL    Comment: Vitamin D  deficiency has been defined by the Institute of Medicine and an Endocrine Society practice guideline as a level of serum 25-OH vitamin D  less than 20 ng/mL (1,2). The Endocrine Society went on to further define vitamin D  insufficiency as a level between 21 and 29 ng/mL (2). 1. IOM (Institute of Medicine). 2010. Dietary reference    intakes for calcium and D. [place]: The    [employer]. 2. Holick MF, [place], Bischoff-Ferrari HA, et al.    Evaluation, treatment, and prevention of vitamin D     deficiency: an Endocrine Society clinical practice    guideline. JCEM. 2011 Jul; 96(7):1911-30.   Vitamin B12     Status: None   Collection Time: 03/22/24  3:52 PM  Result Value Ref Range   Vitamin B-12 562 232 - 1,245 pg/mL  Hemoglobin A1c     Status: None   Collection Time: 03/22/24  3:52 PM  Result Value Ref Range   Hgb A1c MFr Bld 5.2 4.8 - 5.6 %    Comment:          Prediabetes: 5.7 - 6.4          Diabetes: >6.4          Glycemic  control for adults with diabetes: <7.0    Est. average glucose Bld gHb Est-mCnc 103 mg/dL  CBC with Diff     Status: None   Collection Time: 03/22/24  3:52 PM  Result Value Ref Range   WBC 9.6 3.4 - 10.8 x10E3/uL   RBC 4.86 4.14 - 5.80 x10E6/uL   Hemoglobin 15.3 13.0 - 17.7 g/dL   Hematocrit 54.1 62.4 - 51.0 %   MCV 94 79 - 97 fL   MCH 31.5 26.6 - 33.0 pg   MCHC 33.4 31.5 - 35.7 g/dL   RDW 86.6 88.3 - 84.5 %   Platelets 254 150 - 450 x10E3/uL   Neutrophils 60 Not Estab. %   Lymphs 30 Not Estab. %   Monocytes 8 Not Estab. %   Eos 1 Not Estab. %   Basos 1 Not Estab. %   Neutrophils Absolute 5.7 1.4 - 7.0 x10E3/uL   Lymphocytes Absolute 2.9 0.7 - 3.1 x10E3/uL   Monocytes Absolute 0.8 0.1 - 0.9 x10E3/uL   EOS (ABSOLUTE) 0.1 0.0 - 0.4 x10E3/uL   Basophils Absolute 0.1 0.0 - 0.2 x10E3/uL   Immature Granulocytes 0 Not Estab. %   Immature Grans (Abs) 0.0 0.0 - 0.1 x10E3/uL       Assessment & Plan Right elbow pain Getting repeat XR of right elbow, given continued pain.  Will call with results when availble.   Acute diffuse otitis externa of both ears Sending antibiotic drops for ears.  Will let me know if these do not improve his symptoms.     Return in about 1 month (around 03/20/2024).   Total time spent: 20 minutes  ALAN CHRISTELLA ARRANT, FNP  02/19/2024   This document may have been prepared by [facility] Voice Recognition software and as such may include unintentional dictation errors.

## 2024-04-22 ENCOUNTER — Other Ambulatory Visit: Payer: Self-pay | Admitting: Family

## 2024-04-29 ENCOUNTER — Encounter: Payer: Self-pay | Admitting: Family

## 2024-04-29 NOTE — Assessment & Plan Note (Signed)
 Checking labs today.  Continue current therapy for lipid control. Will modify as needed based on labwork results.   -CMP w/eGFR -Lipid Panel

## 2024-04-29 NOTE — Assessment & Plan Note (Signed)
 Checking labs today Will call with results when available.

## 2024-04-29 NOTE — Assessment & Plan Note (Signed)
 Checking labs today.  Will continue supplements as needed.   - Vitamin D  - Vitamin B12 - TSH

## 2024-04-30 ENCOUNTER — Ambulatory Visit: Payer: Self-pay

## 2024-05-10 NOTE — Telephone Encounter (Signed)
 Per Alan verbal please let pt know that his cholesterol is WAY up, has he been taking the nexlizet ? We need to get it under control, he's at a greater risk for a heart attack with it being uncontrolled

## 2024-05-14 ENCOUNTER — Other Ambulatory Visit: Payer: Self-pay

## 2024-05-14 MED ORDER — REPATHA SURECLICK 140 MG/ML ~~LOC~~ SOAJ: 140.0000 mg | SUBCUTANEOUS | 2 refills | Status: DC

## 2024-05-14 NOTE — Progress Notes (Signed)
 Repatha sent to pharmacy

## 2024-05-28 ENCOUNTER — Encounter: Payer: Self-pay | Admitting: Family

## 2024-05-28 ENCOUNTER — Ambulatory Visit: Admitting: Family

## 2024-05-28 VITALS — BP 106/60 | HR 78 | Ht 69.0 in | Wt 137.4 lb

## 2024-05-28 DIAGNOSIS — K219 Gastro-esophageal reflux disease without esophagitis: Secondary | ICD-10-CM

## 2024-05-28 DIAGNOSIS — E782 Mixed hyperlipidemia: Secondary | ICD-10-CM | POA: Diagnosis not present

## 2024-05-28 DIAGNOSIS — R252 Cramp and spasm: Secondary | ICD-10-CM | POA: Diagnosis not present

## 2024-05-28 DIAGNOSIS — Z131 Encounter for screening for diabetes mellitus: Secondary | ICD-10-CM

## 2024-05-28 DIAGNOSIS — Z0130 Encounter for examination of blood pressure without abnormal findings: Secondary | ICD-10-CM

## 2024-05-28 DIAGNOSIS — Z1339 Encounter for screening examination for other mental health and behavioral disorders: Secondary | ICD-10-CM | POA: Diagnosis not present

## 2024-05-28 MED ORDER — ESZOPICLONE 1 MG PO TABS: 1.0000 mg | ORAL_TABLET | Freq: Every evening | ORAL | 2 refills | Status: AC | PRN

## 2024-05-28 MED ORDER — TIZANIDINE HCL 4 MG PO TABS: 4.0000 mg | ORAL_TABLET | Freq: Four times a day (QID) | ORAL | 0 refills | Status: AC | PRN

## 2024-05-29 NOTE — Assessment & Plan Note (Signed)
 Patient stable.  Well controlled with current therapy.   Continue current meds.

## 2024-05-29 NOTE — Assessment & Plan Note (Signed)
 Sending RX for Tizanidine for leg cramps.  They will let me know if he is not doing better with this.

## 2024-05-29 NOTE — Progress Notes (Signed)
 Established Patient Office Visit  Subjective:  Patient ID: Lucas Cook, male    DOB: 21-Nov-1975  Age: 48 y.o. MRN: [id number]  Chief Complaint  Patient presents with   Follow-up    2 month follow up    Patient is here today for his 2 months follow up.  He has been feeling fairly well since last appointment.   He does have additional concerns to discuss today.  He has been continuing to have cramping in his right calf, typically when he is at home after work, usually in bed. Says that the pain has been bad enough that his wife has heard him screaming in his sleep.   He also has not yet heard from the referral for the ADHD center.  Asks if we can resend the referral and give them his wife's contact info to help ensure the appointment gets scheduled.   Labs are not due today.  He needs refills.   I have reviewed his active problem list, medication list, allergies, notes from last encounter, lab results for his appointment today.      No other concerns at this time.   Past Medical History:  Diagnosis Date   Rupture of triceps tendon 09/08/2023   Spontaneous rupture of other tendons, right upper arm 09/08/2023    History reviewed. No pertinent surgical history.  Social History   Socioeconomic History   Marital status: Married    Spouse name: Not on file   Number of children: Not on file   Years of education: Not on file   Highest education level: Not on file  Occupational History   Not on file  Tobacco Use   Smoking status: Every Day    Types: Cigarettes   Smokeless tobacco: Never   Tobacco comments:    Patient taking Varenicline   Substance and Sexual Activity   Alcohol use: Not on file   Drug use: Not on file   Sexual activity: Not on file  Other Topics Concern   Not on file  Social History Narrative   Not on file   Social Drivers of Health   Financial Resource Strain: Not on file  Food Insecurity: Not on file  Transportation Needs: Not on file   Physical Activity: Not on file  Stress: Not on file  Social Connections: Not on file  Intimate Partner Violence: Not on file    History reviewed. No pertinent family history.  No Known Allergies  Review of Systems  Musculoskeletal:  Positive for myalgias.  All other systems reviewed and are negative.      Objective:   BP 106/60   Pulse 78   Ht 5' 9 (1.753 m)   Wt 137 lb 6.4 oz (62.3 kg)   SpO2 98%   BMI 20.29 kg/m   Vitals:   05/28/24 1442  BP: 106/60  Pulse: 78  Height: 5' 9 (1.753 m)  Weight: 137 lb 6.4 oz (62.3 kg)  SpO2: 98%  BMI (Calculated): 20.28    Physical Exam Vitals and nursing note reviewed.  Constitutional:      Appearance: Normal appearance. He is normal weight.  Eyes:     Pupils: Pupils are equal, round, and reactive to light.  Cardiovascular:     Rate and Rhythm: Normal rate and regular rhythm.     Pulses: Normal pulses.     Heart sounds: Normal heart sounds.  Pulmonary:     Effort: Pulmonary effort is normal.     Breath sounds: Normal breath  sounds.  Neurological:     General: No focal deficit present.     Mental Status: He is alert and oriented to person, place, and time. Mental status is at baseline.  Psychiatric:        Mood and Affect: Mood normal.        Behavior: Behavior normal.        Thought Content: Thought content normal.        Judgment: Judgment normal.      No results found for any visits on 05/28/24.  Recent Results (from the past 2160 hours)  CMP14+EGFR     Status: Abnormal   Collection Time: 03/22/24  3:52 PM  Result Value Ref Range   Glucose 82 70 - 99 mg/dL   BUN 12 6 - 24 mg/dL   Creatinine, Ser 8.91 0.76 - 1.27 mg/dL   eGFR 86 >40 fO/fpw/8.26   BUN/Creatinine Ratio 11 9 - 20   Sodium 139 134 - 144 mmol/L   Potassium 3.6 3.5 - 5.2 mmol/L   Chloride 108 (H) 96 - 106 mmol/L   CO2 14 (L) 20 - 29 mmol/L    Comment: **Verified by repeat analysis**   Calcium 9.0 8.7 - 10.2 mg/dL   Total Protein 7.0 6.0 -  8.5 g/dL   Albumin 4.6 4.1 - 5.1 g/dL   Globulin, Total 2.4 1.5 - 4.5 g/dL   Bilirubin Total <9.7 0.0 - 1.2 mg/dL   Alkaline Phosphatase 80 44 - 121 IU/L   AST 24 0 - 40 IU/L   ALT 12 0 - 44 IU/L  Lipid panel     Status: Abnormal   Collection Time: 03/22/24  3:52 PM  Result Value Ref Range   Cholesterol, Total 223 (H) 100 - 199 mg/dL   Triglycerides 77 0 - 149 mg/dL   HDL 32 (L) >60 mg/dL   VLDL Cholesterol Cal 14 5 - 40 mg/dL   LDL Chol Calc (NIH) 822 (H) 0 - 99 mg/dL   Chol/HDL Ratio 7.0 (H) 0.0 - 5.0 ratio    Comment:                                   T. Chol/HDL Ratio                                             Men  Women                               1/2 Avg.Risk  3.4    3.3                                   Avg.Risk  5.0    4.4                                2X Avg.Risk  9.6    7.1                                3X Avg.Risk 23.4   11.0   VITAMIN D  25 Hydroxy (Vit-D  Deficiency, Fractures)     Status: Abnormal   Collection Time: 03/22/24  3:52 PM  Result Value Ref Range   Vit D, 25-Hydroxy 26.8 (L) 30.0 - 100.0 ng/mL    Comment: Vitamin D  deficiency has been defined by the Institute of Medicine and an Endocrine Society practice guideline as a level of serum 25-OH vitamin D  less than 20 ng/mL (1,2). The Endocrine Society went on to further define vitamin D  insufficiency as a level between 21 and 29 ng/mL (2). 1. IOM (Institute of Medicine). 2010. Dietary reference    intakes for calcium and D. [place]: The    [employer]. 2. Holick MF, [place], Bischoff-Ferrari HA, et al.    Evaluation, treatment, and prevention of vitamin D     deficiency: an Endocrine Society clinical practice    guideline. JCEM. 2011 Jul; 96(7):1911-30.   Vitamin B12     Status: None   Collection Time: 03/22/24  3:52 PM  Result Value Ref Range   Vitamin B-12 562 232 - 1,245 pg/mL  Hemoglobin A1c     Status: None   Collection Time: 03/22/24  3:52 PM  Result Value Ref Range    Hgb A1c MFr Bld 5.2 4.8 - 5.6 %    Comment:          Prediabetes: 5.7 - 6.4          Diabetes: >6.4          Glycemic control for adults with diabetes: <7.0    Est. average glucose Bld gHb Est-mCnc 103 mg/dL  CBC with Diff     Status: None   Collection Time: 03/22/24  3:52 PM  Result Value Ref Range   WBC 9.6 3.4 - 10.8 x10E3/uL   RBC 4.86 4.14 - 5.80 x10E6/uL   Hemoglobin 15.3 13.0 - 17.7 g/dL   Hematocrit 54.1 62.4 - 51.0 %   MCV 94 79 - 97 fL   MCH 31.5 26.6 - 33.0 pg   MCHC 33.4 31.5 - 35.7 g/dL   RDW 86.6 88.3 - 84.5 %   Platelets 254 150 - 450 x10E3/uL   Neutrophils 60 Not Estab. %   Lymphs 30 Not Estab. %   Monocytes 8 Not Estab. %   Eos 1 Not Estab. %   Basos 1 Not Estab. %   Neutrophils Absolute 5.7 1.4 - 7.0 x10E3/uL   Lymphocytes Absolute 2.9 0.7 - 3.1 x10E3/uL   Monocytes Absolute 0.8 0.1 - 0.9 x10E3/uL   EOS (ABSOLUTE) 0.1 0.0 - 0.4 x10E3/uL   Basophils Absolute 0.1 0.0 - 0.2 x10E3/uL   Immature Granulocytes 0 Not Estab. %   Immature Grans (Abs) 0.0 0.0 - 0.1 x10E3/uL       Assessment & Plan Attention deficit hyperactivity disorder (ADHD) evaluation Will resend referral with wife's information attached. I will also get contact information for them.   Mixed hyperlipidemia Continue current therapy for lipid control. Will modify as needed based on labwork results.   Gastroesophageal reflux disease without esophagitis Patient stable.  Well controlled with current therapy.   Continue current meds.   Leg cramps Sending RX for Tizanidine for leg cramps.  They will let me know if he is not doing better with this.      Return in about 1 month (around 06/27/2024) for F/U.   Total time spent: 20 minutes  ALAN CHRISTELLA ARRANT, FNP  05/28/2024   This document may have been prepared by [facility] Voice Recognition software and as such may include  unintentional dictation errors.

## 2024-05-29 NOTE — Assessment & Plan Note (Signed)
 Continue current therapy for lipid control. Will modify as needed based on labwork results.

## 2024-05-29 NOTE — Assessment & Plan Note (Signed)
 Will resend referral with wife's information attached. I will also get contact information for them.

## 2024-06-28 ENCOUNTER — Ambulatory Visit: Admitting: Family

## 2024-07-05 ENCOUNTER — Encounter: Payer: Self-pay | Admitting: Family

## 2024-07-05 ENCOUNTER — Ambulatory Visit: Admitting: Family

## 2024-07-05 VITALS — BP 106/76 | HR 65 | Ht 69.0 in | Wt 134.8 lb

## 2024-07-05 DIAGNOSIS — K219 Gastro-esophageal reflux disease without esophagitis: Secondary | ICD-10-CM

## 2024-07-05 DIAGNOSIS — R5383 Other fatigue: Secondary | ICD-10-CM | POA: Diagnosis not present

## 2024-07-05 DIAGNOSIS — Z1211 Encounter for screening for malignant neoplasm of colon: Secondary | ICD-10-CM

## 2024-07-05 DIAGNOSIS — E559 Vitamin D deficiency, unspecified: Secondary | ICD-10-CM | POA: Diagnosis not present

## 2024-07-05 DIAGNOSIS — E538 Deficiency of other specified B group vitamins: Secondary | ICD-10-CM | POA: Diagnosis not present

## 2024-07-05 DIAGNOSIS — E782 Mixed hyperlipidemia: Secondary | ICD-10-CM | POA: Diagnosis not present

## 2024-07-05 DIAGNOSIS — Z0130 Encounter for examination of blood pressure without abnormal findings: Secondary | ICD-10-CM

## 2024-07-05 DIAGNOSIS — Z1339 Encounter for screening examination for other mental health and behavioral disorders: Secondary | ICD-10-CM

## 2024-07-05 DIAGNOSIS — Z131 Encounter for screening for diabetes mellitus: Secondary | ICD-10-CM

## 2024-07-05 MED ORDER — ESOMEPRAZOLE MAGNESIUM 40 MG PO CPDR: 40.0000 mg | DELAYED_RELEASE_CAPSULE | Freq: Every day | ORAL | 1 refills | Status: AC

## 2024-07-05 NOTE — Assessment & Plan Note (Signed)
-   Check labs today - Supplementation recommended based off lab results and will notify patient at that time

## 2024-07-05 NOTE — Assessment & Plan Note (Signed)
-   New Psychiatry referral sent.

## 2024-07-05 NOTE — Progress Notes (Signed)
 Established Patient Office Visit  Subjective:  Patient ID: Lucas Cook, male    DOB: 22-Oct-1975  Age: 48 y.o. MRN: [id number]  Chief Complaint  Patient presents with   Follow-up    1 month follow up    Patient is here today for his 1 month follow up.  He has been feeling well since last appointment.   He does have additional concerns to discuss today. Want referral to new adhd office that is in [place] instead of the one we sent into [place].  Labs are due today.  He needs refills.   I have reviewed his active problem list, medication list, allergies, family history, social history, health maintenance, notes from last encounter, lab results for his appointment today.       No other concerns at this time.   Past Medical History:  Diagnosis Date   Rupture of triceps tendon 09/08/2023   Spontaneous rupture of other tendons, right upper arm 09/08/2023    No past surgical history on file.  Social History   Socioeconomic History   Marital status: Married    Spouse name: Not on file   Number of children: Not on file   Years of education: Not on file   Highest education level: Not on file  Occupational History   Not on file  Tobacco Use   Smoking status: Every Day    Types: Cigarettes   Smokeless tobacco: Never   Tobacco comments:    Patient taking Varenicline   Substance and Sexual Activity   Alcohol use: Not on file   Drug use: Not on file   Sexual activity: Not on file  Other Topics Concern   Not on file  Social History Narrative   Not on file   Social Drivers of Health   Financial Resource Strain: Not on file  Food Insecurity: Not on file  Transportation Needs: Not on file  Physical Activity: Not on file  Stress: Not on file  Social Connections: Not on file  Intimate Partner Violence: Not on file    No family history on file.  No Known Allergies  Review of Systems  Constitutional:  Negative for malaise/fatigue.  HENT: Negative.     Eyes:  Negative for blurred vision and pain.  Respiratory:  Negative for cough and shortness of breath.   Cardiovascular:  Negative for chest pain, palpitations, claudication and leg swelling.  Gastrointestinal:  Negative for abdominal pain, blood in stool, constipation, diarrhea, nausea and vomiting.  Genitourinary:  Negative for dysuria, frequency and urgency.  Musculoskeletal: Negative.   Skin: Negative.   Neurological:  Negative for dizziness, tingling, sensory change and headaches.  Endo/Heme/Allergies: Negative.   Psychiatric/Behavioral: Negative.         Objective:   BP 106/76   Pulse 65   Ht 5' 9 (1.753 m)   Wt 134 lb 12.8 oz (61.1 kg)   SpO2 97%   BMI 19.91 kg/m   Vitals:   07/05/24 1459  BP: 106/76  Pulse: 65  Height: 5' 9 (1.753 m)  Weight: 134 lb 12.8 oz (61.1 kg)  SpO2: 97%  BMI (Calculated): 19.9    Physical Exam Vitals and nursing note reviewed.  Constitutional:      Appearance: Normal appearance.  HENT:     Head: Normocephalic.  Eyes:     Extraocular Movements: Extraocular movements intact.     Pupils: Pupils are equal, round, and reactive to light.  Cardiovascular:     Rate and Rhythm: Normal rate  and regular rhythm.     Pulses: Normal pulses.     Heart sounds: Normal heart sounds. No murmur heard. Pulmonary:     Effort: Pulmonary effort is normal. No respiratory distress.     Breath sounds: Normal breath sounds.  Abdominal:     General: There is no distension.     Tenderness: There is no abdominal tenderness.  Musculoskeletal:        General: No tenderness. Normal range of motion.     Cervical back: Normal range of motion and neck supple.     Right lower leg: No edema.     Left lower leg: No edema.  Skin:    General: Skin is warm and dry.     Coloration: Skin is not jaundiced.     Findings: No erythema.  Neurological:     General: No focal deficit present.     Mental Status: He is alert and oriented to person, place, and time.   Psychiatric:        Mood and Affect: Mood normal.        Speech: Speech normal.        Behavior: Behavior is cooperative.        Cognition and Memory: Memory is not impaired.      No results found for any visits on 07/05/24.  No results found for this or any previous visit (from the past 2160 hours).     Assessment & Plan:   Assessment & Plan Vitamin D  deficiency, unspecified B12 deficiency due to diet Other fatigue - Check labs today - Supplementation recommended based off lab results and will notify patient at that time  Mixed hyperlipidemia - Continue healthy diet and exercise as tolerated. - Continue medications as prescribed. - Check labs today  Gastroesophageal reflux disease without esophagitis - Refill sent for GERD medication. Continue medication as prescribed. - Avoid dietary triggers. - Don't eat within 30 minutes of bed time. - Reduce intake of alcohol, smoking, chocolate, caffeine. Colon cancer screening - GI referral sent Attention deficit hyperactivity disorder (ADHD) evaluation - New Psychiatry referral sent.    Return in about 3 months (around 10/05/2024).   Total time spent: 25 minutes  Oddis DELENA Cain, FNP  07/05/2024   This document may have been prepared by [facility] Voice Recognition software and as such may include unintentional dictation errors.

## 2024-07-05 NOTE — Assessment & Plan Note (Signed)
-   Refill sent for GERD medication. Continue medication as prescribed. - Avoid dietary triggers. - Don't eat within 30 minutes of bed time. - Reduce intake of alcohol, smoking, chocolate, caffeine.

## 2024-07-05 NOTE — Assessment & Plan Note (Signed)
 GI referral sent

## 2024-07-05 NOTE — Assessment & Plan Note (Signed)
-   Continue healthy diet and exercise as tolerated. - Continue medications as prescribed. - Check labs today

## 2024-07-06 ENCOUNTER — Encounter: Payer: Self-pay | Admitting: Family

## 2024-07-06 LAB — CBC WITH DIFFERENTIAL/PLATELET
Basophils Absolute: 0.1 x10E3/uL (ref 0.0–0.2)
Basos: 1 %
EOS (ABSOLUTE): 0.1 x10E3/uL (ref 0.0–0.4)
Eos: 1 %
Hematocrit: 43.4 % (ref 37.5–51.0)
Hemoglobin: 14.6 g/dL (ref 13.0–17.7)
Immature Grans (Abs): 0 x10E3/uL (ref 0.0–0.1)
Immature Granulocytes: 0 %
Lymphocytes Absolute: 2.5 x10E3/uL (ref 0.7–3.1)
Lymphs: 33 %
MCH: 32.2 pg (ref 26.6–33.0)
MCHC: 33.6 g/dL (ref 31.5–35.7)
MCV: 96 fL (ref 79–97)
Monocytes Absolute: 0.6 x10E3/uL (ref 0.1–0.9)
Monocytes: 7 %
Neutrophils Absolute: 4.5 x10E3/uL (ref 1.4–7.0)
Neutrophils: 58 %
Platelets: 259 x10E3/uL (ref 150–450)
RBC: 4.54 x10E6/uL (ref 4.14–5.80)
RDW: 12.8 % (ref 11.6–15.4)
WBC: 7.8 x10E3/uL (ref 3.4–10.8)

## 2024-07-06 LAB — CMP14+EGFR
ALT: 10 IU/L (ref 0–44)
AST: 22 IU/L (ref 0–40)
Albumin: 4.6 g/dL (ref 4.1–5.1)
Alkaline Phosphatase: 73 IU/L (ref 47–123)
BUN/Creatinine Ratio: 10 (ref 9–20)
BUN: 9 mg/dL (ref 6–24)
Bilirubin Total: <0.2 mg/dL (ref 0.0–1.2)
CO2: 19 mmol/L — ABNORMAL LOW (ref 20–29)
Calcium: 9.1 mg/dL (ref 8.7–10.2)
Chloride: 107 mmol/L — ABNORMAL HIGH (ref 96–106)
Creatinine, Ser: 0.89 mg/dL (ref 0.76–1.27)
Globulin, Total: 2.1 g/dL (ref 1.5–4.5)
Glucose: 78 mg/dL (ref 70–99)
Potassium: 3.5 mmol/L (ref 3.5–5.2)
Sodium: 142 mmol/L (ref 134–144)
Total Protein: 6.7 g/dL (ref 6.0–8.5)
eGFR: 107 mL/min/1.73 (ref >59)

## 2024-07-06 LAB — VITAMIN B12: Vitamin B-12: 1686 pg/mL — ABNORMAL HIGH (ref 232–1245)

## 2024-07-06 LAB — HEMOGLOBIN A1C
Est. average glucose Bld gHb Est-mCnc: 97 mg/dL
Hgb A1c MFr Bld: 5 % (ref 4.8–5.6)

## 2024-07-06 LAB — LIPID PANEL
Chol/HDL Ratio: 2.4 ratio (ref 0.0–5.0)
Cholesterol, Total: 92 mg/dL — ABNORMAL LOW (ref 100–199)
HDL: 39 mg/dL — ABNORMAL LOW (ref >39)
LDL Chol Calc (NIH): 38 mg/dL (ref 0–99)
Triglycerides: 70 mg/dL (ref 0–149)
VLDL Cholesterol Cal: 15 mg/dL (ref 5–40)

## 2024-07-06 LAB — VITAMIN D 25 HYDROXY (VIT D DEFICIENCY, FRACTURES): Vit D, 25-Hydroxy: 32.5 ng/mL (ref 30.0–100.0)

## 2024-07-09 ENCOUNTER — Other Ambulatory Visit: Payer: Self-pay

## 2024-07-09 ENCOUNTER — Telehealth: Payer: Self-pay

## 2024-07-09 DIAGNOSIS — Z1211 Encounter for screening for malignant neoplasm of colon: Secondary | ICD-10-CM

## 2024-07-09 MED ORDER — NA SULFATE-K SULFATE-MG SULF 17.5-3.13-1.6 GM/177ML PO SOLN: 354.0000 mL | Freq: Once | ORAL | 0 refills | Status: AC

## 2024-07-09 NOTE — Addendum Note (Signed)
 Addended by: JODIE HEADINGS on: 07/09/2024 10:16 AM   Modules accepted: Orders

## 2024-07-09 NOTE — Telephone Encounter (Signed)
 Called patient to schedule his colonoscopy. Patient agreed to have it done on 10/09/2023 with Dr. Melany at [facility]. Patient was informed of instructions and notified him that I would be sending his instructions via MyChart and mail. Prescription will be sent to his pharmacy [facility]. Patient understood and had no further questions.

## 2024-07-09 NOTE — Telephone Encounter (Signed)
 Gastroenterology Pre-Procedure Review  Request Date: 10/08/2024 Requesting Physician: Dr. Dr. Melany  PATIENT REVIEW QUESTIONS: The patient responded to the following health history questions as indicated:    1. Are you having any GI issues? no 2. Do you have a personal history of Polyps? no 3. Do you have a family history of Colon Cancer or Polyps? no 4. Diabetes Mellitus? no 5. Joint replacements in the past 12 months?no 6. Major health problems in the past 3 months?no 7. Any artificial heart valves, MVP, or defibrillator?no    MEDICATIONS & ALLERGIES:    Patient reports the following regarding taking any anticoagulation/antiplatelet therapy:   Plavix, Coumadin, Eliquis, Xarelto, Lovenox, Pradaxa, Brilinta, or Effient? no Aspirin? no  Patient confirms/reports the following medications:  Current Outpatient Medications  Medication Sig Dispense Refill   esomeprazole (NEXIUM) 40 MG capsule Take 1 capsule (40 mg total) by mouth daily. 90 capsule 1   eszopiclone  (LUNESTA ) 1 MG TABS tablet Take 1 tablet (1 mg total) by mouth at bedtime as needed for sleep. Take immediately before bedtime 30 tablet 2   Evolocumab  (REPATHA  SURECLICK) 140 MG/ML SOAJ Inject 140 mg into the skin every 14 (fourteen) days. 2 mL 2   Na Sulfate-K Sulfate-Mg Sulfate concentrate (SUPREP) 17.5-3.13-1.6 GM/177ML SOLN Take 1 kit (354 mLs total) by mouth once for 1 dose. Starting at 5 PM take one bottle and pour into the supplied cup, add cool water to the fill 16 oz line and drink all. Then 5 hours before procedure pour the second bottle into the supplied cup, add cool water to the fill 16 oz line and drink all. 354 mL 0   tiZANidine (ZANAFLEX) 4 MG tablet Take 1 tablet (4 mg total) by mouth every 6 (six) hours as needed for muscle spasms. 60 tablet 0   No current facility-administered medications for this visit.    Patient confirms/reports the following allergies:  No Known Allergies  No orders of the defined types  were placed in this encounter.   AUTHORIZATION INFORMATION Primary Insurance: 1D#: Group #:  Secondary Insurance: 1D#: Group #:  SCHEDULE INFORMATION: Date: 10/08/2024 Time: Location: [facility]

## 2024-08-17 ENCOUNTER — Other Ambulatory Visit: Payer: Self-pay | Admitting: Family

## 2024-09-02 ENCOUNTER — Telehealth: Payer: Self-pay

## 2024-09-11 ENCOUNTER — Telehealth: Payer: Self-pay

## 2024-09-11 NOTE — Telephone Encounter (Signed)
 PT was denied procedure per  [facility].We can do peer to peer or we have to cancel. I can schedule a peer to peer if you would like. Please give times and a phone number is you want to do a peer to peer

## 2024-09-12 ENCOUNTER — Other Ambulatory Visit: Payer: Self-pay

## 2024-09-12 MED ORDER — BENZONATATE 100 MG PO CAPS: 100.0000 mg | ORAL_CAPSULE | Freq: Three times a day (TID) | ORAL | 0 refills | Status: AC | PRN

## 2024-09-12 NOTE — Telephone Encounter (Signed)
 Per Dr.Scholl will do peer to peer at 4:00pm on 09-13-2024 with Dr. Roda. [phone]

## 2024-09-14 ENCOUNTER — Other Ambulatory Visit: Payer: Self-pay | Admitting: Family

## 2024-09-17 NOTE — Telephone Encounter (Signed)
 Ref call [id number] on [phone]

## 2024-09-17 NOTE — Telephone Encounter (Signed)
 Per [phone] [facility]  MD  must write an appeal letter for case number  [id number] stating the medical necessity along with any updates clinic note. [employer] must include the appeal letter. Fax all document [phone]

## 2024-10-03 ENCOUNTER — Ambulatory Visit: Payer: Self-pay

## 2024-10-03 NOTE — Telephone Encounter (Signed)
 Appeal fax to [phone] /[id number]

## 2024-10-04 ENCOUNTER — Other Ambulatory Visit: Payer: Self-pay

## 2024-10-04 ENCOUNTER — Telehealth: Payer: Self-pay

## 2024-10-04 DIAGNOSIS — Z1211 Encounter for screening for malignant neoplasm of colon: Secondary | ICD-10-CM

## 2024-10-04 NOTE — Telephone Encounter (Signed)
 Called patient and asked if we could reschedule his procedure and he stated that he could reschedule it for 10/21/2024 with Dr. Melany. Carley and Olam will be notified. I will place a new order since he will be going to [facility] ([facility]).

## 2024-10-04 NOTE — Telephone Encounter (Signed)
 Please change appt date for procedure due to appeals. Don't cancel just please move out to a later date .  Md has completed  a peer to peer it wasn't approved . [employer] have sent in an appeal letter .Per [facility] could take up til 10-18-2024 to get approved. Call ref [id number]  [phone]    Will you let me know the date you move the procedure to.

## 2024-10-07 ENCOUNTER — Ambulatory Visit: Admitting: Family

## 2024-10-08 ENCOUNTER — Encounter: Admission: RE | Payer: Self-pay | Source: Home / Self Care

## 2024-10-08 ENCOUNTER — Ambulatory Visit: Admission: RE | Admit: 2024-10-08 | Admitting: Gastroenterology

## 2024-10-21 ENCOUNTER — Ambulatory Visit: Admit: 2024-10-21 | Admitting: Gastroenterology
